# Patient Record
Sex: Female | Born: 1965 | Race: Black or African American | Hispanic: No | Marital: Single | Smoking: Never smoker
Health system: Southern US, Community
[De-identification: ages and names within clinical notes are randomized; demographics above are authoritative.]

## PROBLEM LIST (undated history)

## (undated) DIAGNOSIS — J45909 Unspecified asthma, uncomplicated: Secondary | ICD-10-CM

## (undated) DIAGNOSIS — D251 Intramural leiomyoma of uterus: Secondary | ICD-10-CM

## (undated) DIAGNOSIS — F4322 Adjustment disorder with anxiety: Secondary | ICD-10-CM

## (undated) DIAGNOSIS — I10 Essential (primary) hypertension: Secondary | ICD-10-CM

## (undated) DIAGNOSIS — G43829 Menstrual migraine, not intractable, without status migrainosus: Secondary | ICD-10-CM

## (undated) HISTORY — DX: Unspecified asthma, uncomplicated: J45.909

## (undated) HISTORY — DX: Intramural leiomyoma of uterus: D25.1

## (undated) HISTORY — PX: COSMETIC SURGERY: SHX468

## (undated) HISTORY — DX: Adjustment disorder with anxiety: F43.22

## (undated) HISTORY — DX: Menstrual migraine, not intractable, without status migrainosus: G43.829

## (undated) HISTORY — DX: Essential (primary) hypertension: I10

---

## 2014-05-01 ENCOUNTER — Emergency Department: Payer: Self-pay | Admitting: Emergency Medicine

## 2014-09-22 DIAGNOSIS — I10 Essential (primary) hypertension: Secondary | ICD-10-CM

## 2014-09-22 DIAGNOSIS — D649 Anemia, unspecified: Secondary | ICD-10-CM | POA: Insufficient documentation

## 2014-09-22 HISTORY — DX: Essential (primary) hypertension: I10

## 2014-10-13 DIAGNOSIS — D251 Intramural leiomyoma of uterus: Secondary | ICD-10-CM | POA: Insufficient documentation

## 2014-10-13 DIAGNOSIS — N920 Excessive and frequent menstruation with regular cycle: Secondary | ICD-10-CM | POA: Insufficient documentation

## 2014-10-13 DIAGNOSIS — F4322 Adjustment disorder with anxiety: Secondary | ICD-10-CM

## 2014-10-13 DIAGNOSIS — J45909 Unspecified asthma, uncomplicated: Secondary | ICD-10-CM

## 2014-10-13 DIAGNOSIS — G43829 Menstrual migraine, not intractable, without status migrainosus: Secondary | ICD-10-CM

## 2014-10-13 HISTORY — DX: Intramural leiomyoma of uterus: D25.1

## 2014-10-13 HISTORY — DX: Menstrual migraine, not intractable, without status migrainosus: G43.829

## 2014-10-13 HISTORY — DX: Unspecified asthma, uncomplicated: J45.909

## 2014-10-13 HISTORY — DX: Adjustment disorder with anxiety: F43.22

## 2015-09-11 ENCOUNTER — Other Ambulatory Visit: Payer: Self-pay | Admitting: Otolaryngology

## 2015-09-11 DIAGNOSIS — H9319 Tinnitus, unspecified ear: Secondary | ICD-10-CM

## 2015-09-15 ENCOUNTER — Ambulatory Visit: Payer: 59

## 2015-09-20 ENCOUNTER — Ambulatory Visit: Payer: 59

## 2015-10-06 ENCOUNTER — Ambulatory Visit: Payer: 59

## 2015-10-12 ENCOUNTER — Ambulatory Visit
Admission: RE | Admit: 2015-10-12 | Discharge: 2015-10-12 | Disposition: A | Discharge status: Home / Self Care | Payer: 59 | Source: Ambulatory Visit | Attending: Otolaryngology | Admitting: Otolaryngology

## 2015-10-12 ENCOUNTER — Ambulatory Visit: Payer: 59

## 2015-10-12 DIAGNOSIS — H9319 Tinnitus, unspecified ear: Secondary | ICD-10-CM | POA: Diagnosis not present

## 2015-10-12 DIAGNOSIS — H748X2 Other specified disorders of left middle ear and mastoid: Secondary | ICD-10-CM | POA: Diagnosis not present

## 2015-11-16 ENCOUNTER — Other Ambulatory Visit: Payer: Self-pay | Admitting: Otolaryngology

## 2015-11-16 DIAGNOSIS — H7012 Chronic mastoiditis, left ear: Secondary | ICD-10-CM

## 2015-12-19 ENCOUNTER — Encounter: Payer: Self-pay | Admitting: Obstetrics and Gynecology

## 2015-12-19 ENCOUNTER — Ambulatory Visit (INDEPENDENT_AMBULATORY_CARE_PROVIDER_SITE_OTHER): Payer: 59 | Admitting: Obstetrics and Gynecology

## 2015-12-19 VITALS — BP 118/85 | HR 89 | Ht 59.0 in | Wt 143.8 lb

## 2015-12-19 DIAGNOSIS — Z124 Encounter for screening for malignant neoplasm of cervix: Secondary | ICD-10-CM

## 2015-12-19 DIAGNOSIS — Z01419 Encounter for gynecological examination (general) (routine) without abnormal findings: Secondary | ICD-10-CM | POA: Diagnosis not present

## 2015-12-19 DIAGNOSIS — Z803 Family history of malignant neoplasm of breast: Secondary | ICD-10-CM | POA: Diagnosis not present

## 2015-12-19 DIAGNOSIS — Z1239 Encounter for other screening for malignant neoplasm of breast: Secondary | ICD-10-CM

## 2015-12-19 DIAGNOSIS — D6489 Other specified anemias: Secondary | ICD-10-CM | POA: Diagnosis not present

## 2015-12-19 NOTE — Patient Instructions (Signed)
Preventive Care for Adults, Female A healthy lifestyle and preventive care can promote health and wellness. Preventive health guidelines for women include the following key practices.  A routine yearly physical is a good way to check with your health care provider about your health and preventive screening. It is a chance to share any concerns and updates on your health and to receive a thorough exam.  Visit your dentist for a routine exam and preventive care every 6 months. Brush your teeth twice a day and floss once a day. Good oral hygiene prevents tooth decay and gum disease.  The frequency of eye exams is based on your age, health, family medical history, use of contact lenses, and other factors. Follow your health care provider's recommendations for frequency of eye exams.  Eat a healthy diet. Foods like vegetables, fruits, whole grains, low-fat dairy products, and lean protein foods contain the nutrients you need without too many calories. Decrease your intake of foods high in solid fats, added sugars, and salt. Eat the right amount of calories for you.Get information about a proper diet from your health care provider, if necessary.  Regular physical exercise is one of the most important things you can do for your health. Most adults should get at least 150 minutes of moderate-intensity exercise (any activity that increases your heart rate and causes you to sweat) each week. In addition, most adults need muscle-strengthening exercises on 2 or more days a week.  Maintain a healthy weight. The body mass index (BMI) is a screening tool to identify possible weight problems. It provides an estimate of body fat based on height and weight. Your health care provider can find your BMI and can help you achieve or maintain a healthy weight.For adults 20 years and older:  A BMI below 18.5 is considered underweight.  A BMI of 18.5 to 24.9 is normal.  A BMI of 25 to 29.9 is considered  overweight.  A BMI of 30 and above is considered obese.  Maintain normal blood lipids and cholesterol levels by exercising and minimizing your intake of saturated fat. Eat a balanced diet with plenty of fruit and vegetables. Blood tests for lipids and cholesterol should begin at age 64 and be repeated every 5 years. If your lipid or cholesterol levels are high, you are over 50, or you are at high risk for heart disease, you may need your cholesterol levels checked more frequently.Ongoing high lipid and cholesterol levels should be treated with medicines if diet and exercise are not working.  If you smoke, find out from your health care provider how to quit. If you do not use tobacco, do not start.  Lung cancer screening is recommended for adults aged 52-80 years who are at high risk for developing lung cancer because of a history of smoking. A yearly low-dose CT scan of the lungs is recommended for people who have at least a 30-pack-year history of smoking and are a current smoker or have quit within the past 15 years. A pack year of smoking is smoking an average of 1 pack of cigarettes a day for 1 year (for example: 1 pack a day for 30 years or 2 packs a day for 15 years). Yearly screening should continue until the smoker has stopped smoking for at least 15 years. Yearly screening should be stopped for people who develop a health problem that would prevent them from having lung cancer treatment.  If you are pregnant, do not drink alcohol. If you are  breastfeeding, be very cautious about drinking alcohol. If you are not pregnant and choose to drink alcohol, do not have more than 1 drink per day. One drink is considered to be 12 ounces (355 mL) of beer, 5 ounces (148 mL) of wine, or 1.5 ounces (44 mL) of liquor.  Avoid use of street drugs. Do not share needles with anyone. Ask for help if you need support or instructions about stopping the use of drugs.  High blood pressure causes heart disease and  increases the risk of stroke. Your blood pressure should be checked at least every 1 to 2 years. Ongoing high blood pressure should be treated with medicines if weight loss and exercise do not work.  If you are 25-78 years old, ask your health care provider if you should take aspirin to prevent strokes.  Diabetes screening is done by taking a blood sample to check your blood glucose level after you have not eaten for a certain period of time (fasting). If you are not overweight and you do not have risk factors for diabetes, you should be screened once every 3 years starting at age 86. If you are overweight or obese and you are 3-87 years of age, you should be screened for diabetes every year as part of your cardiovascular risk assessment.  Breast cancer screening is essential preventive care for women. You should practice "breast self-awareness." This means understanding the normal appearance and feel of your breasts and may include breast self-examination. Any changes detected, no matter how small, should be reported to a health care provider. Women in their 66s and 30s should have a clinical breast exam (CBE) by a health care provider as part of a regular health exam every 1 to 3 years. After age 43, women should have a CBE every year. Starting at age 37, women should consider having a mammogram (breast X-ray test) every year. Women who have a family history of breast cancer should talk to their health care provider about genetic screening. Women at a high risk of breast cancer should talk to their health care providers about having an MRI and a mammogram every year.  Breast cancer gene (BRCA)-related cancer risk assessment is recommended for women who have family members with BRCA-related cancers. BRCA-related cancers include breast, ovarian, tubal, and peritoneal cancers. Having family members with these cancers may be associated with an increased risk for harmful changes (mutations) in the breast  cancer genes BRCA1 and BRCA2. Results of the assessment will determine the need for genetic counseling and BRCA1 and BRCA2 testing.  Your health care provider may recommend that you be screened regularly for cancer of the pelvic organs (ovaries, uterus, and vagina). This screening involves a pelvic examination, including checking for microscopic changes to the surface of your cervix (Pap test). You may be encouraged to have this screening done every 3 years, beginning at age 78.  For women ages 79-65, health care providers may recommend pelvic exams and Pap testing every 3 years, or they may recommend the Pap and pelvic exam, combined with testing for human papilloma virus (HPV), every 5 years. Some types of HPV increase your risk of cervical cancer. Testing for HPV may also be done on women of any age with unclear Pap test results.  Other health care providers may not recommend any screening for nonpregnant women who are considered low risk for pelvic cancer and who do not have symptoms. Ask your health care provider if a screening pelvic exam is right for  you.  If you have had past treatment for cervical cancer or a condition that could lead to cancer, you need Pap tests and screening for cancer for at least 20 years after your treatment. If Pap tests have been discontinued, your risk factors (such as having a new sexual partner) need to be reassessed to determine if screening should resume. Some women have medical problems that increase the chance of getting cervical cancer. In these cases, your health care provider may recommend more frequent screening and Pap tests.  Colorectal cancer can be detected and often prevented. Most routine colorectal cancer screening begins at the age of 50 years and continues through age 75 years. However, your health care provider may recommend screening at an earlier age if you have risk factors for colon cancer. On a yearly basis, your health care provider may provide  home test kits to check for hidden blood in the stool. Use of a small camera at the end of a tube, to directly examine the colon (sigmoidoscopy or colonoscopy), can detect the earliest forms of colorectal cancer. Talk to your health care provider about this at age 50, when routine screening begins. Direct exam of the colon should be repeated every 5-10 years through age 75 years, unless early forms of precancerous polyps or small growths are found.  People who are at an increased risk for hepatitis B should be screened for this virus. You are considered at high risk for hepatitis B if:  You were born in a country where hepatitis B occurs often. Talk with your health care provider about which countries are considered high risk.  Your parents were born in a high-risk country and you have not received a shot to protect against hepatitis B (hepatitis B vaccine).  You have HIV or AIDS.  You use needles to inject street drugs.  You live with, or have sex with, someone who has hepatitis B.  You get hemodialysis treatment.  You take certain medicines for conditions like cancer, organ transplantation, and autoimmune conditions.  Hepatitis C blood testing is recommended for all people born from 1945 through 1965 and any individual with known risks for hepatitis C.  Practice safe sex. Use condoms and avoid high-risk sexual practices to reduce the spread of sexually transmitted infections (STIs). STIs include gonorrhea, chlamydia, syphilis, trichomonas, herpes, HPV, and human immunodeficiency virus (HIV). Herpes, HIV, and HPV are viral illnesses that have no cure. They can result in disability, cancer, and death.  You should be screened for sexually transmitted illnesses (STIs) including gonorrhea and chlamydia if:  You are sexually active and are younger than 24 years.  You are older than 24 years and your health care provider tells you that you are at risk for this type of infection.  Your sexual  activity has changed since you were last screened and you are at an increased risk for chlamydia or gonorrhea. Ask your health care provider if you are at risk.  If you are at risk of being infected with HIV, it is recommended that you take a prescription medicine daily to prevent HIV infection. This is called preexposure prophylaxis (PrEP). You are considered at risk if:  You are sexually active and do not regularly use condoms or know the HIV status of your partner(s).  You take drugs by injection.  You are sexually active with a partner who has HIV.  Talk with your health care provider about whether you are at high risk of being infected with HIV. If   you choose to begin PrEP, you should first be tested for HIV. You should then be tested every 3 months for as long as you are taking PrEP.  Osteoporosis is a disease in which the bones lose minerals and strength with aging. This can result in serious bone fractures or breaks. The risk of osteoporosis can be identified using a bone density scan. Women ages 1 years and over and women at risk for fractures or osteoporosis should discuss screening with their health care providers. Ask your health care provider whether you should take a calcium supplement or vitamin D to reduce the rate of osteoporosis.  Menopause can be associated with physical symptoms and risks. Hormone replacement therapy is available to decrease symptoms and risks. You should talk to your health care provider about whether hormone replacement therapy is right for you.  Use sunscreen. Apply sunscreen liberally and repeatedly throughout the day. You should seek shade when your shadow is shorter than you. Protect yourself by wearing long sleeves, pants, a wide-brimmed hat, and sunglasses year round, whenever you are outdoors.  Once a month, do a whole body skin exam, using a mirror to look at the skin on your back. Tell your health care provider of new moles, moles that have irregular  borders, moles that are larger than a pencil eraser, or moles that have changed in shape or color.  Stay current with required vaccines (immunizations).  Influenza vaccine. All adults should be immunized every year.  Tetanus, diphtheria, and acellular pertussis (Td, Tdap) vaccine. Pregnant women should receive 1 dose of Tdap vaccine during each pregnancy. The dose should be obtained regardless of the length of time since the last dose. Immunization is preferred during the 27th-36th week of gestation. An adult who has not previously received Tdap or who does not know her vaccine status should receive 1 dose of Tdap. This initial dose should be followed by tetanus and diphtheria toxoids (Td) booster doses every 10 years. Adults with an unknown or incomplete history of completing a 3-dose immunization series with Td-containing vaccines should begin or complete a primary immunization series including a Tdap dose. Adults should receive a Td booster every 10 years.  Varicella vaccine. An adult without evidence of immunity to varicella should receive 2 doses or a second dose if she has previously received 1 dose. Pregnant females who do not have evidence of immunity should receive the first dose after pregnancy. This first dose should be obtained before leaving the health care facility. The second dose should be obtained 4-8 weeks after the first dose.  Human papillomavirus (HPV) vaccine. Females aged 13-26 years who have not received the vaccine previously should obtain the 3-dose series. The vaccine is not recommended for use in pregnant females. However, pregnancy testing is not needed before receiving a dose. If a female is found to be pregnant after receiving a dose, no treatment is needed. In that case, the remaining doses should be delayed until after the pregnancy. Immunization is recommended for any person with an immunocompromised condition through the age of 24 years if she did not get any or all doses  earlier. During the 3-dose series, the second dose should be obtained 4-8 weeks after the first dose. The third dose should be obtained 24 weeks after the first dose and 16 weeks after the second dose.  Zoster vaccine. One dose is recommended for adults aged 97 years or older unless certain conditions are present.  Measles, mumps, and rubella (MMR) vaccine. Adults born  before 1957 generally are considered immune to measles and mumps. Adults born in [birth year] or later should have 1 or more doses of MMR vaccine unless there is a contraindication to the vaccine or there is laboratory evidence of immunity to each of the three diseases. A routine second dose of MMR vaccine should be obtained at least 28 days after the first dose for students attending postsecondary schools, health care workers, or international travelers. People who received inactivated measles vaccine or an unknown type of measles vaccine during 1963-1967 should receive 2 doses of MMR vaccine. People who received inactivated mumps vaccine or an unknown type of mumps vaccine before 1979 and are at high risk for mumps infection should consider immunization with 2 doses of MMR vaccine. For females of childbearing age, rubella immunity should be determined. If there is no evidence of immunity, females who are not pregnant should be vaccinated. If there is no evidence of immunity, females who are pregnant should delay immunization until after pregnancy. Unvaccinated health care workers born before 60 who lack laboratory evidence of measles, mumps, or rubella immunity or laboratory confirmation of disease should consider measles and mumps immunization with 2 doses of MMR vaccine or rubella immunization with 1 dose of MMR vaccine.  Pneumococcal 13-valent conjugate (PCV13) vaccine. When indicated, a person who is uncertain of his immunization history and has no record of immunization should receive the PCV13 vaccine. All adults 61 years of age and older  should receive this vaccine. An adult aged 92 years or older who has certain medical conditions and has not been previously immunized should receive 1 dose of PCV13 vaccine. This PCV13 should be followed with a dose of pneumococcal polysaccharide (PPSV23) vaccine. Adults who are at high risk for pneumococcal disease should obtain the PPSV23 vaccine at least 8 weeks after the dose of PCV13 vaccine. Adults older than 48 years of age who have normal immune system function should obtain the PPSV23 vaccine dose at least 1 year after the dose of PCV13 vaccine.  Pneumococcal polysaccharide (PPSV23) vaccine. When PCV13 is also indicated, PCV13 should be obtained first. All adults aged 2 years and older should be immunized. An adult younger than age 30 years who has certain medical conditions should be immunized. Any person who resides in a nursing home or long-term care facility should be immunized. An adult smoker should be immunized. People with an immunocompromised condition and certain other conditions should receive both PCV13 and PPSV23 vaccines. People with human immunodeficiency virus (HIV) infection should be immunized as soon as possible after diagnosis. Immunization during chemotherapy or radiation therapy should be avoided. Routine use of PPSV23 vaccine is not recommended for American Indians, [place] Natives, or people younger than 65 years unless there are medical conditions that require PPSV23 vaccine. When indicated, people who have unknown immunization and have no record of immunization should receive PPSV23 vaccine. One-time revaccination 5 years after the first dose of PPSV23 is recommended for people aged 19-64 years who have chronic kidney failure, nephrotic syndrome, asplenia, or immunocompromised conditions. People who received 1-2 doses of PPSV23 before age 44 years should receive another dose of PPSV23 vaccine at age 83 years or later if at least 5 years have passed since the previous dose. Doses  of PPSV23 are not needed for people immunized with PPSV23 at or after age 20 years.  Meningococcal vaccine. Adults with asplenia or persistent complement component deficiencies should receive 2 doses of quadrivalent meningococcal conjugate (MenACWY-D) vaccine. The doses should be obtained  at least 2 months apart. Microbiologists working with certain meningococcal bacteria, [place] recruits, people at risk during an outbreak, and people who travel to or live in countries with a high rate of meningitis should be immunized. A first-year college student up through age 28 years who is living in a residence hall should receive a dose if she did not receive a dose on or after her 16th birthday. Adults who have certain high-risk conditions should receive one or more doses of vaccine.  Hepatitis A vaccine. Adults who wish to be protected from this disease, have certain high-risk conditions, work with hepatitis A-infected animals, work in hepatitis A research labs, or travel to or work in countries with a high rate of hepatitis A should be immunized. Adults who were previously unvaccinated and who anticipate close contact with an international adoptee during the first 60 days after arrival in the [place] from a country with a high rate of hepatitis A should be immunized.  Hepatitis B vaccine. Adults who wish to be protected from this disease, have certain high-risk conditions, may be exposed to blood or other infectious body fluids, are household contacts or sex partners of hepatitis B positive people, are clients or workers in certain care facilities, or travel to or work in countries with a high rate of hepatitis B should be immunized.  Haemophilus influenzae type b (Hib) vaccine. A previously unvaccinated person with asplenia or sickle cell disease or having a scheduled splenectomy should receive 1 dose of Hib vaccine. Regardless of previous immunization, a recipient of a hematopoietic stem cell transplant  should receive a 3-dose series 6-12 months after her successful transplant. Hib vaccine is not recommended for adults with HIV infection. Preventive Services / Frequency Ages 71 to 87 years  Blood pressure check.** / Every 3-5 years.  Lipid and cholesterol check.** / Every 5 years beginning at age 1.  Clinical breast exam.** / Every 3 years for women in their 3s and 31s.  BRCA-related cancer risk assessment.** / For women who have family members with a BRCA-related cancer (breast, ovarian, tubal, or peritoneal cancers).  Pap test.** / Every 2 years from ages 50 through 86. Every 3 years starting at age 87 through age 7 or 75 with a history of 3 consecutive normal Pap tests.  HPV screening.** / Every 3 years from ages 59 through ages 35 to 6 with a history of 3 consecutive normal Pap tests.  Hepatitis C blood test.** / For any individual with known risks for hepatitis C.  Skin self-exam. / Monthly.  Influenza vaccine. / Every year.  Tetanus, diphtheria, and acellular pertussis (Tdap, Td) vaccine.** / Consult your health care provider. Pregnant women should receive 1 dose of Tdap vaccine during each pregnancy. 1 dose of Td every 10 years.  Varicella vaccine.** / Consult your health care provider. Pregnant females who do not have evidence of immunity should receive the first dose after pregnancy.  HPV vaccine. / 3 doses over 6 months, if 72 and younger. The vaccine is not recommended for use in pregnant females. However, pregnancy testing is not needed before receiving a dose.  Measles, mumps, rubella (MMR) vaccine.** / You need at least 1 dose of MMR if you were born in 1957 or later. You may also need a 2nd dose. For females of childbearing age, rubella immunity should be determined. If there is no evidence of immunity, females who are not pregnant should be vaccinated. If there is no evidence of immunity, females who are  pregnant should delay immunization until after  pregnancy.  Pneumococcal 13-valent conjugate (PCV13) vaccine.** / Consult your health care provider.  Pneumococcal polysaccharide (PPSV23) vaccine.** / 1 to 2 doses if you smoke cigarettes or if you have certain conditions.  Meningococcal vaccine.** / 1 dose if you are age 87 to 44 years and a Market researcher living in a residence hall, or have one of several medical conditions, you need to get vaccinated against meningococcal disease. You may also need additional booster doses.  Hepatitis A vaccine.** / Consult your health care provider.  Hepatitis B vaccine.** / Consult your health care provider.  Haemophilus influenzae type b (Hib) vaccine.** / Consult your health care provider. Ages 86 to 38 years  Blood pressure check.** / Every year.  Lipid and cholesterol check.** / Every 5 years beginning at age 49 years.  Lung cancer screening. / Every year if you are aged 71-80 years and have a 30-pack-year history of smoking and currently smoke or have quit within the past 15 years. Yearly screening is stopped once you have quit smoking for at least 15 years or develop a health problem that would prevent you from having lung cancer treatment.  Clinical breast exam.** / Every year after age 51 years.  BRCA-related cancer risk assessment.** / For women who have family members with a BRCA-related cancer (breast, ovarian, tubal, or peritoneal cancers).  Mammogram.** / Every year beginning at age 18 years and continuing for as long as you are in good health. Consult with your health care provider.  Pap test.** / Every 3 years starting at age 63 years through age 37 or 57 years with a history of 3 consecutive normal Pap tests.  HPV screening.** / Every 3 years from ages 41 years through ages 76 to 23 years with a history of 3 consecutive normal Pap tests.  Fecal occult blood test (FOBT) of stool. / Every year beginning at age 36 years and continuing until age 51 years. You may not need  to do this test if you get a colonoscopy every 10 years.  Flexible sigmoidoscopy or colonoscopy.** / Every 5 years for a flexible sigmoidoscopy or every 10 years for a colonoscopy beginning at age 36 years and continuing until age 35 years.  Hepatitis C blood test.** / For all people born from [birth year] through 1965 and any individual with known risks for hepatitis C.  Skin self-exam. / Monthly.  Influenza vaccine. / Every year.  Tetanus, diphtheria, and acellular pertussis (Tdap/Td) vaccine.** / Consult your health care provider. Pregnant women should receive 1 dose of Tdap vaccine during each pregnancy. 1 dose of Td every 10 years.  Varicella vaccine.** / Consult your health care provider. Pregnant females who do not have evidence of immunity should receive the first dose after pregnancy.  Zoster vaccine.** / 1 dose for adults aged 73 years or older.  Measles, mumps, rubella (MMR) vaccine.** / You need at least 1 dose of MMR if you were born in 1957 or later. You may also need a second dose. For females of childbearing age, rubella immunity should be determined. If there is no evidence of immunity, females who are not pregnant should be vaccinated. If there is no evidence of immunity, females who are pregnant should delay immunization until after pregnancy.  Pneumococcal 13-valent conjugate (PCV13) vaccine.** / Consult your health care provider.  Pneumococcal polysaccharide (PPSV23) vaccine.** / 1 to 2 doses if you smoke cigarettes or if you have certain conditions.  Meningococcal vaccine.** /  Consult your health care provider.  Hepatitis A vaccine.** / Consult your health care provider.  Hepatitis B vaccine.** / Consult your health care provider.  Haemophilus influenzae type b (Hib) vaccine.** / Consult your health care provider. Ages 80 years and over  Blood pressure check.** / Every year.  Lipid and cholesterol check.** / Every 5 years beginning at age 62 years.  Lung cancer  screening. / Every year if you are aged 32-80 years and have a 30-pack-year history of smoking and currently smoke or have quit within the past 15 years. Yearly screening is stopped once you have quit smoking for at least 15 years or develop a health problem that would prevent you from having lung cancer treatment.  Clinical breast exam.** / Every year after age 61 years.  BRCA-related cancer risk assessment.** / For women who have family members with a BRCA-related cancer (breast, ovarian, tubal, or peritoneal cancers).  Mammogram.** / Every year beginning at age 39 years and continuing for as long as you are in good health. Consult with your health care provider.  Pap test.** / Every 3 years starting at age 85 years through age 74 or 72 years with 3 consecutive normal Pap tests. Testing can be stopped between 65 and 70 years with 3 consecutive normal Pap tests and no abnormal Pap or HPV tests in the past 10 years.  HPV screening.** / Every 3 years from ages 55 years through ages 67 or 77 years with a history of 3 consecutive normal Pap tests. Testing can be stopped between 65 and 70 years with 3 consecutive normal Pap tests and no abnormal Pap or HPV tests in the past 10 years.  Fecal occult blood test (FOBT) of stool. / Every year beginning at age 81 years and continuing until age 22 years. You may not need to do this test if you get a colonoscopy every 10 years.  Flexible sigmoidoscopy or colonoscopy.** / Every 5 years for a flexible sigmoidoscopy or every 10 years for a colonoscopy beginning at age 67 years and continuing until age 22 years.  Hepatitis C blood test.** / For all people born from [birth year] through 1963 and any individual with known risks for hepatitis C.  Osteoporosis screening.** / A one-time screening for women ages 8 years and over and women at risk for fractures or osteoporosis.  Skin self-exam. / Monthly.  Influenza vaccine. / Every year.  Tetanus, diphtheria, and  acellular pertussis (Tdap/Td) vaccine.** / 1 dose of Td every 10 years.  Varicella vaccine.** / Consult your health care provider.  Zoster vaccine.** / 1 dose for adults aged 56 years or older.  Pneumococcal 13-valent conjugate (PCV13) vaccine.** / Consult your health care provider.  Pneumococcal polysaccharide (PPSV23) vaccine.** / 1 dose for all adults aged 15 years and older.  Meningococcal vaccine.** / Consult your health care provider.  Hepatitis A vaccine.** / Consult your health care provider.  Hepatitis B vaccine.** / Consult your health care provider.  Haemophilus influenzae type b (Hib) vaccine.** / Consult your health care provider. ** Family history and personal history of risk and conditions may change your health care provider's recommendations.   This information is not intended to replace advice given to you by your health care provider. Make sure you discuss any questions you have with your health care provider.   Document Released: 01/28/2002 Document Revised: 12/23/2014 Document Reviewed: 04/29/2011 Elsevier Interactive Patient Education [employer].

## 2015-12-19 NOTE — Progress Notes (Signed)
GYNECOLOGY ANNUAL EXAM CLINIC VISIT Subjective:    Briana [name] is a 50 y.o. [phone] female who presents to establish care and for an annual exam. The patient is transitioning care from [facility], [place]. The patient has no complaints today. The patient is sexually active (1 partner). GYN screening history: last pap: approximate date 2-3 years ago and was normal. The patient wears seatbelts: yes. The patient participates in regular exercise: no. Has the patient ever been transfused or tattooed?: no. The patient reports that there is not domestic violence in her life.   Menstrual History: OB History  Gravida Para Term Preterm AB SAB TAB Ectopic Multiple Living  6 2 2  4     2     # Outcome Date GA Lbr Len/2nd Weight Sex Delivery Anes PTL Lv  6 Term 33     Vag-Spont   Y  5 Term 26     Vag-Spont   Y  4 AB           3 AB           2 AB           1 AB               Menarche age: 50 Patient's last menstrual period was 11/25/2015. Period Pattern: Regular Menstrual Flow: Heavy Denies h/o abnormal pap smears, or STIs.    Past Medical History  Diagnosis Date  . Asthma   . Fibroids, intramural 10/13/2014  . Essential (primary) hypertension 09/22/2014  . Headache, menstrual migraine 10/13/2014  . Adjustment disorder with anxiety 10/13/2014    Family History  Problem Relation Age of Onset  . Hypertension Mother   . Breast cancer Mother 70    Past Surgical History  Procedure Laterality Date  . Cosmetic surgery      Social History   Social History  . Marital Status: Single    Spouse Name: N/A  . Number of Children: N/A  . Years of Education: N/A   Occupational History  . Not on file.   Social History Main Topics  . Smoking status: Never Smoker   . Smokeless tobacco: Not on file  . Alcohol Use: Yes  . Drug Use: No  . Sexual Activity: Yes    Birth Control/ Protection: Pill   Other Topics Concern  . Not on file   Social History Narrative  . No narrative on  file    Outpatient Encounter Prescriptions as of 12/19/2015  Medication Sig Note  . amLODipine (NORVASC) 10 MG tablet TAKE 1 TABLET (10 MG TOTAL) BY MOUTH DAILY. 12/19/2015: Received from: External Pharmacy  . azelastine (ASTELIN) 0.1 % nasal spray SPRAY ONCE IN EACH NOSTRIL TWICE DAILY 12/19/2015: Received from: External Pharmacy  . Fluticasone-Salmeterol (ADVAIR DISKUS) 250-50 MCG/DOSE AEPB INHALE 1 PUFF 2 TIMES A DAY. Appointment necessary for further refills. 12/19/2015: Received from: [facility]  . loratadine (CLARITIN) 10 MG tablet Take 10 mg by mouth. 12/19/2015: Received from: [facility]  . ORTHO MICRONOR 0.35 MG tablet  12/19/2015: Received from: External Pharmacy  . QNASL 80 MCG/ACT AERS Place 2 sprays into both nostrils daily. 12/19/2015: Received from: External Pharmacy  . [DISCONTINUED] amLODipine (NORVASC) 10 MG tablet Take 10 mg by mouth. 12/19/2015: Received from: [facility]   No facility-administered encounter medications on file as of 12/19/2015.    No Known Allergies   Review of Systems Constitutional: negative for chills, fatigue, fevers and sweats Eyes: negative for  irritation, redness and visual disturbance Ears, nose, mouth, throat, and face: negative for hearing loss, nasal congestion, snoring and tinnitus Respiratory: negative for asthma, cough, sputum Cardiovascular: negative for chest pain, dyspnea, exertional chest pressure/discomfort, irregular heart beat, palpitations and syncope Gastrointestinal: negative for abdominal pain, change in bowel habits, nausea and vomiting Genitourinary: positive for heavy menstrual periods; negative genital lesions, sexual problems and vaginal discharge, dysuria and urinary incontinence Integument/breast: negative for breast lump, breast tenderness and nipple discharge Hematologic/lymphatic: negative for bleeding and easy bruising Musculoskeletal:negative for back pain and muscle weakness Neurological: negative for dizziness,  headaches, vertigo and weakness Endocrine: negative for diabetic symptoms including polydipsia, polyuria and skin dryness Allergic/Immunologic: negative for hay fever and urticaria     Objective:    Blood pressure 118/85, pulse 89, height 4\' 11"  (1.499 m), weight 143 lb 12.8 oz (65.227 kg), last menstrual period 11/25/2015. Body mass index is 29.03 kg/(m^2).   General Appearance:    Alert, cooperative, no distress, appears stated age  Head:    Normocephalic, without obvious abnormality, atraumatic  Eyes:    PERRL, conjunctiva/corneas clear, EOM's intact, both eyes  Ears:    Normal external ear canals, both ears  Nose:   Nares normal, septum midline, mucosa normal, no drainage or sinus tenderness  Throat:   Lips, mucosa, and tongue normal; teeth and gums normal  Neck:   Supple, symmetrical, trachea midline, no adenopathy; thyroid: no enlargement/tenderness/nodules; no carotid bruit or JVD  Back:     Symmetric, no curvature, ROM normal, no CVA tenderness  Lungs:     Clear to auscultation bilaterally, respirations unlabored  Chest Wall:    No tenderness or deformity   Heart:    Regular rate and rhythm, S1 and S2 normal, no murmur, rub or gallop  Breast Exam:    No tenderness, masses, or nipple abnormality  Abdomen:     Soft, non-tender, bowel sounds active all four quadrants, no masses, no organomegaly.  Well healed low transverse abdominal scar.    Genitalia:    Pelvic:external genitalia normal, vagina without lesions, discharge, or tenderness, rectovaginal septum  normal. Cervix normal in appearance, no cervical motion tenderness, no adnexal masses or tenderness. Uterus slightly enlarged (~ 12 week sized), mobile, nontender.   Rectal:    Normal external sphincter.  No hemorrhoids appreciated. Internal exam not done.   Extremities:   Extremities normal, atraumatic, no cyanosis or edema  Pulses:   2+ and symmetric all extremities  Skin:   Skin color, texture, turgor normal, no rashes or  lesions  Lymph nodes:   Cervical, supraclavicular, and axillary nodes normal  Neurologic:   CNII-XII intact, normal strength, sensation and reflexes throughout    Assessment:    Healthy female exam.   HTN Heavy menses Overweight  Plan:     Breast self exam technique reviewed and patient encouraged to perform self-exam monthly. Contraception: oral progesterone-only contraceptive. Has been on pills x 1 month (previously on Mirena IUD for heavy menses but removed after ~ 6 months due to side effects). Noted abnormal prolonged spotting for first month which resolved last week. Discussed healthy lifestyle modifications. Pap smear.   Up to date on immunizations.  Has PCP to manage HTN. Due to be seen next month.  Follow up in 1 year, or as needed.   Rubie Maid, MD [facility] 12/19/2015 9:09 AM

## 2015-12-20 ENCOUNTER — Ambulatory Visit: Admission: RE | Admit: 2015-12-20 | Payer: 59 | Source: Ambulatory Visit

## 2015-12-22 LAB — PAP IG AND HPV HIGH-RISK
HPV, HIGH-RISK: NEGATIVE
PAP SMEAR COMMENT: 0

## 2015-12-26 ENCOUNTER — Telehealth: Payer: Self-pay

## 2015-12-26 NOTE — Telephone Encounter (Signed)
-----   Message from Rubie Maid, MD sent at 12/22/2015  2:01 PM EST ----- Please inform patient of negative pap, and sign up for mychart.

## 2015-12-26 NOTE — Telephone Encounter (Signed)
Called pt & LM for pt to call back

## 2016-07-04 ENCOUNTER — Ambulatory Visit
Admission: RE | Admit: 2016-07-04 | Discharge: 2016-07-04 | Disposition: A | Discharge status: Home / Self Care | Payer: 59 | Source: Ambulatory Visit | Attending: Internal Medicine | Admitting: Internal Medicine

## 2016-07-04 ENCOUNTER — Ambulatory Visit (INDEPENDENT_AMBULATORY_CARE_PROVIDER_SITE_OTHER): Payer: 59 | Admitting: Internal Medicine

## 2016-07-04 ENCOUNTER — Encounter: Payer: Self-pay | Admitting: Internal Medicine

## 2016-07-04 ENCOUNTER — Other Ambulatory Visit
Admission: RE | Admit: 2016-07-04 | Discharge: 2016-07-04 | Disposition: A | Discharge status: Home / Self Care | Payer: 59 | Source: Ambulatory Visit | Attending: Internal Medicine | Admitting: Internal Medicine

## 2016-07-04 VITALS — BP 132/68 | HR 73 | Ht 59.5 in | Wt 145.4 lb

## 2016-07-04 DIAGNOSIS — J45909 Unspecified asthma, uncomplicated: Secondary | ICD-10-CM | POA: Insufficient documentation

## 2016-07-04 DIAGNOSIS — R0602 Shortness of breath: Secondary | ICD-10-CM

## 2016-07-04 DIAGNOSIS — J4520 Mild intermittent asthma, uncomplicated: Secondary | ICD-10-CM

## 2016-07-04 DIAGNOSIS — G4730 Sleep apnea, unspecified: Secondary | ICD-10-CM

## 2016-07-04 NOTE — Patient Instructions (Signed)
1.check PFT's with [facility] 2.CXR 3.sleep study 4.avoid allergens  Asthma Attack Prevention While you may not be able to control the fact that you have asthma, you can take actions to prevent asthma attacks. The best way to prevent asthma attacks is to maintain good control of your asthma. You can achieve this by:  Taking your medicines as directed.  Avoiding things that can irritate your airways or make your asthma symptoms worse (asthma triggers).  Keeping track of how well your asthma is controlled and of any changes in your symptoms.  Responding quickly to worsening asthma symptoms (asthma attack).  Seeking emergency care when it is needed. WHAT ARE SOME WAYS TO PREVENT AN ASTHMA ATTACK? Have a Plan Work with your health care provider to create a written plan for managing and treating your asthma attacks (asthma action plan). This plan includes:  A list of your asthma triggers and how you can avoid them.  Information on when medicines should be taken and when their dosages should be changed.  The use of a device that measures how well your lungs are working (peak flow meter). Monitor Your Asthma Use your peak flow meter and record your results in a journal every day. A drop in your peak flow numbers on one or more days may indicate the start of an asthma attack. This can happen even before you start to feel symptoms. You can prevent an asthma attack from getting worse by following the steps in your asthma action plan. Avoid Asthma Triggers Work with your asthma health care provider to find out what your asthma triggers are. This can be done by:  Allergy testing.  Keeping a journal that notes when asthma attacks occur and the factors that may have contributed to them.  Determining if there are other medical conditions that are making your asthma worse. Once you have determined your asthma triggers, take steps to avoid them. This may include avoiding excessive or prolonged exposure  to:  Dust. Have someone dust and vacuum your home for you once or twice a week. Using a high-efficiency particulate arrestance (HEPA) vacuum is best.  Smoke. This includes campfire smoke, forest fire smoke, and secondhand smoke from tobacco products.  Pet dander. Avoid contact with animals that you know you are allergic to.  Allergens from trees, grasses or pollens. Avoid spending a lot of time outdoors when pollen counts are high, and on very windy days.  Very cold, dry, or humid air.  Mold.  Foods that contain high amounts of sulfites.  Strong odors.  Outdoor air pollutants, such as Lexic[employer].  Indoor air pollutants, such as aerosol sprays and fumes from household cleaners.  Household pests, including dust mites and cockroaches, and pest droppings.  Certain medicines, including NSAIDs. Always talk to your health care provider before stopping or starting any new medicines. Medicines Take over-the-counter and prescription medicines only as told by your health care provider. Many asthma attacks can be prevented by carefully following your medicine schedule. Taking your medicines correctly is especially important when you cannot avoid certain asthma triggers. Act Quickly If an asthma attack does happen, acting quickly can decrease how severe it is and how long it lasts. Take these steps:   Pay attention to your symptoms. If you are coughing, wheezing, or having difficulty breathing, do not wait to see if your symptoms go away on their own. Follow your asthma action plan.  If you have followed your asthma action plan and your symptoms are not improving,  call your health care provider or seek immediate medical care at the nearest hospital. It is important to note how often you need to use your fast-acting rescue inhaler. If you are using your rescue inhaler more often, it may mean that your asthma is not under control. Adjusting your asthma treatment plan may help you to prevent  future asthma attacks and help you to gain better control of your condition. HOW CAN I PREVENT AN ASTHMA ATTACK WHEN I EXERCISE? Follow advice from your health care provider about whether you should use your fast-acting inhaler before exercising. Many people with asthma experience exercise-induced bronchoconstriction (EIB). This condition often worsens during vigorous exercise in cold, humid, or dry environments. Usually, people with EIB can stay very active by pre-treating with a fast-acting inhaler before exercising.   This information is not intended to replace advice given to you by your health care provider. Make sure you discuss any questions you have with your health care provider.   Document Released: 11/20/2009 Document Revised: 08/23/2015 Document Reviewed: 05/04/2015 Elsevier Interactive Patient Education [employer].

## 2016-07-04 NOTE — Progress Notes (Signed)
[facility] Consultation      Date: 07/04/2016,   MRN# [id number] Briana [name] 1966/06/02 Code Status:  Code Status History    This patient does not have a recorded code status. Please follow your organizational policy for patients in this situation.     Hosp day:@LENGTHOFSTAYDAYS @ Referring MD: @ATDPROV @     PCP:      AdmissionWeight: 145 lb 6.4 oz (65.953 kg)                 CurrentWeight: 145 lb 6.4 oz (65.953 kg) Briana [name] is a 49 y.o. old female seen in consultation for SOB at the request of patient.     CHIEF COMPLAINT:   SOB, can't take deep breath   HISTORY OF PRESENT ILLNESS  50 yo AAF seen today for main complaint of not getting enough of a deep breath Patient has been having this problem for 6 months, no fevers, chills. 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 Asthma   . Fibroids, intramural 10/13/2014  . 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 Essential (primary) hypertension 09/22/2014  . Headache, menstrual migraine 10/13/2014  . Adjustment disorder with anxiety 10/13/2014     SURGICAL HISTORY   Past Surgical History  Procedure Laterality Date  . Cosmetic surgery       FAMILY HISTORY   Family  History  Problem Relation Age of Onset  . Hypertension Mother   . Breast cancer Mother 23     SOCIAL HISTORY   Social History  Substance Use Topics  . Smoking status: Never Smoker   . Smokeless tobacco: None  . Alcohol Use: Yes     Comment: occ     MEDICATIONS    Home Medication:  Current Outpatient Rx  Name  Route  Sig  Dispense  Refill  . amLODipine (NORVASC) 10 MG tablet      TAKE 1 TABLET (10 MG TOTAL) BY MOUTH DAILY.      0   . azelastine (ASTELIN) 0.1 % nasal spray      SPRAY ONCE IN EACH NOSTRIL TWICE DAILY      12   . Fluticasone-Salmeterol (ADVAIR DISKUS) 250-50 MCG/DOSE AEPB      INHALE 1 PUFF 2 TIMES A DAY. Appointment necessary for further refills.         Marland Kitchen loratadine (CLARITIN) 10 MG tablet   Oral   Take 10 mg by mouth.         Briana [name] 80 MCG/ACT AERS   Each Nare   Place 2 sprays into both nostrils daily.      12     Dispense as written.     Current Medication:  Current outpatient prescriptions:  .  amLODipine (NORVASC) 10 MG tablet, TAKE 1 TABLET (10 MG TOTAL) BY MOUTH DAILY., Disp: , Rfl: 0 .  azelastine (ASTELIN) 0.1 % nasal spray, SPRAY ONCE IN EACH NOSTRIL TWICE DAILY, Disp: , Rfl: 12 .  Fluticasone-Salmeterol (ADVAIR DISKUS) 250-50 MCG/DOSE AEPB, INHALE 1 PUFF 2 TIMES A DAY. Appointment necessary for further refills., Disp: , Rfl:  .  loratadine (CLARITIN) 10 MG tablet, Take 10 mg by mouth., Disp: , Rfl:  .  QNASL 80 MCG/ACT AERS, Place 2 sprays into both nostrils daily., Disp: , Rfl: 12    ALLERGIES   Review of patient's allergies indicates no known allergies.     REVIEW OF SYSTEMS   Review of Systems  Constitutional: Positive for malaise/fatigue. Negative for fever, chills, weight loss and diaphoresis.  HENT: Negative for congestion and hearing loss.   Eyes: Negative for blurred vision and double vision.  Respiratory: Negative for cough, hemoptysis, sputum production, shortness of breath and wheezing.     Cardiovascular: Negative for chest pain, palpitations and orthopnea.  Gastrointestinal: Negative for heartburn, nausea, vomiting, abdominal pain, diarrhea, constipation and blood in stool.  Genitourinary: Negative for dysuria and urgency.  Musculoskeletal: Negative for myalgias, back pain and neck pain.  Skin: Negative for rash.  Neurological: Negative for dizziness, tingling, tremors, weakness and headaches.  Endo/Heme/Allergies: Does not bruise/bleed easily.  Psychiatric/Behavioral: Negative for depression, suicidal ideas and substance abuse.  All other systems reviewed and are negative.    VS: BP 132/68 mmHg  Pulse 73  Ht 4' 11.5" (1.511 m)  Wt 145 lb 6.4 oz (65.953 kg)  BMI 28.89 kg/m2  SpO2 97%     PHYSICAL EXAM  Physical Exam  Constitutional: She is oriented to person, place, and time. She appears well-developed and well-nourished. No distress.  HENT:  Head: Normocephalic and atraumatic.  Mouth/Throat: No oropharyngeal exudate.  Eyes: EOM are normal. Pupils are equal, round, and reactive to light. No scleral icterus.  Neck: Normal range of motion. Neck supple.  Cardiovascular: Normal rate, regular rhythm and normal heart sounds.   No murmur heard. Pulmonary/Chest: No stridor. No respiratory distress. She has no wheezes. She has no rales.  Abdominal: Soft. Bowel sounds are normal.  Musculoskeletal: Normal range of motion. She exhibits no edema.  Neurological: She is alert and oriented to person, place, and time. No cranial nerve deficit.  Skin: Skin is warm. She is not diaphoretic.  Psychiatric: She has a normal mood and affect.     ASSESSMENT/PLAN   50 yo AAF with mild intermittent uncomplicated ASTHMA with alergic rhinitis with possible underlying OSA  1.check CXR 2 view 2.check PFT with  3.will order sleep study 4.continue advair, albuterol as needed 5.contihue nasal spray and antihistamine  Follow up after tests have been completed   The Patient  requires high complexity decision making for assessment and support, frequent evaluation and titration of therapies, application of advanced monitoring technologies and extensive interpretation of multiple databases.  Patient satisfied with Plan of action and management. All questions answered  Corrin Parker, M.D.  [facility]  Medical Director [place] Director [facility] Cardio-Pulmonary Department

## 2016-07-30 ENCOUNTER — Ambulatory Visit (INDEPENDENT_AMBULATORY_CARE_PROVIDER_SITE_OTHER): Payer: 59 | Admitting: *Deleted

## 2016-07-30 DIAGNOSIS — J4520 Mild intermittent asthma, uncomplicated: Secondary | ICD-10-CM

## 2016-07-30 DIAGNOSIS — R0602 Shortness of breath: Secondary | ICD-10-CM

## 2016-07-30 LAB — PULMONARY FUNCTION TEST
DL/VA % PRED: 126 %
DL/VA: 5.27 ml/min/mmHg/L
DLCO UNC: 17.81 ml/min/mmHg
DLCO unc % pred: 97 %
FEF 25-75 POST: 2.11 L/s
FEF 25-75 Pre: 1.69 L/sec
FEF2575-%CHANGE-POST: 24 %
FEF2575-%PRED-POST: 94 %
FEF2575-%Pred-Pre: 76 %
FEV1-%Change-Post: 8 %
FEV1-%PRED-PRE: 81 %
FEV1-%Pred-Post: 88 %
FEV1-Post: 1.75 L
FEV1-Pre: 1.61 L
FEV1FVC-%CHANGE-POST: -6 %
FEV1FVC-%PRED-PRE: 102 %
FEV6-%Change-Post: 16 %
FEV6-%Pred-Post: 93 %
FEV6-%Pred-Pre: 80 %
FEV6-PRE: 1.91 L
FEV6-Post: 2.23 L
FEV6FVC-%PRED-POST: 103 %
FEV6FVC-%Pred-Pre: 103 %
FVC-%CHANGE-POST: 16 %
FVC-%PRED-POST: 90 %
FVC-%PRED-PRE: 77 %
FVC-POST: 2.23 L
FVC-PRE: 1.91 L
POST FEV1/FVC RATIO: 78 %
PRE FEV1/FVC RATIO: 84 %
PRE FEV6/FVC RATIO: 100 %
Post FEV6/FVC ratio: 100 %
RV % pred: 119 %
RV: 1.82 L
TLC % pred: 91 %
TLC: 4.02 L

## 2016-07-30 NOTE — Progress Notes (Signed)
SMW performed today. 

## 2016-07-30 NOTE — Progress Notes (Signed)
PFT performed today. 

## 2016-08-05 ENCOUNTER — Encounter: Payer: Self-pay | Admitting: Internal Medicine

## 2016-08-05 ENCOUNTER — Ambulatory Visit (INDEPENDENT_AMBULATORY_CARE_PROVIDER_SITE_OTHER): Payer: 59 | Admitting: Internal Medicine

## 2016-08-05 VITALS — BP 118/84 | HR 80 | Wt 142.0 lb

## 2016-08-05 DIAGNOSIS — J4520 Mild intermittent asthma, uncomplicated: Secondary | ICD-10-CM | POA: Diagnosis not present

## 2016-08-05 NOTE — Progress Notes (Signed)
[facility] Consultation      Date: 08/05/2016,   MRN# [id number] Briana [name] 16-Jul-1966 Code Status:  Code Status History    This patient does not have a recorded code status. Please follow your organizational policy for patients in this situation.     Hosp day:@LENGTHOFSTAYDAYS @ Referring MD: @ATDPROV @     PCP:      Admission                  Current  Briana [name] is a 50 y.o. old female seen in consultation for SOB at the request of patient.    PFT'S 07/2016 RATIO 82% FEV1 81% DLCO 85% Fef25/75 midl small airways obstructive disease  6MWT 1410 FEET 97%    CHIEF COMPLAINT:   Follow up ASTHMA   HISTORY OF PRESENT ILLNESS  No SOb, no wheezing, feels better since allergy season is over I have reviewed PFT;s with patient and discussed CXR results  Patient has been using advair as needed and not taking daily Feels better with advair as needed  patient dx with ASTHMA when she was 50 years old No frequent ER visits, no intubations, no prednisone in years Patient has allergic rhinitis that is controlled with antihistamine  No signs of infection at this time  Sleep study pending     Current Medication:  Current Outpatient Prescriptions:  .  amLODipine (NORVASC) 10 MG tablet, TAKE 1 TABLET (10 MG TOTAL) BY MOUTH DAILY., Disp: , Rfl: 0 .  azelastine (ASTELIN) 0.1 % nasal spray, SPRAY ONCE IN EACH NOSTRIL TWICE DAILY, Disp: , Rfl: 12 .  Fluticasone-Salmeterol (ADVAIR DISKUS) 250-50 MCG/DOSE AEPB, INHALE 1 PUFF 2 TIMES A DAY. Appointment necessary for further refills., Disp: , Rfl:  .  loratadine (CLARITIN) 10 MG tablet, Take 10 mg by mouth., Disp: , Rfl:  .  QNASL 80 MCG/ACT AERS, Place 2 sprays into both nostrils daily., Disp: , Rfl: 12    ALLERGIES   Review of patient's allergies indicates no known allergies.     REVIEW OF SYSTEMS   Review of Systems  Constitutional: Negative for chills, diaphoresis, fever, malaise/fatigue  and weight loss.  HENT: Negative for congestion.   Eyes: Negative for blurred vision and double vision.  Respiratory: Negative for cough, hemoptysis, sputum production, shortness of breath and wheezing.   Cardiovascular: Negative for chest pain, palpitations, orthopnea and leg swelling.  Gastrointestinal: Negative for heartburn and nausea.  Musculoskeletal: Negative for neck pain.  Neurological: Negative for weakness.  All other systems reviewed and are negative.    VS:   BP 118/84 (BP Location: Left Arm, Cuff Size: Normal)   Pulse 80   Wt 142 lb (64.4 kg)   SpO2 97%   BMI 28.20 kg/m     PHYSICAL EXAM  Physical Exam  Constitutional: She is oriented to person, place, and time. She appears well-developed and well-nourished. No distress.  Neck: Neck supple.  Cardiovascular: Normal rate, regular rhythm and normal heart sounds.   No murmur heard. Pulmonary/Chest: Effort normal and breath sounds normal. No stridor. No respiratory distress. She has no wheezes. She has no rales.  Musculoskeletal: Normal range of motion. She exhibits no edema.  Neurological: She is alert and oriented to person, place, and time. No cranial nerve deficit.  Skin: Skin is warm. She is not diaphoretic.  Psychiatric: She has a normal mood and affect.    CXR 2 VIEW IMAGES REVIEWED 08/05/2016    ASSESSMENT/PLAN   50 yo AAF with  mild intermittent uncomplicated ASTHMA with alergic rhinitis with possible underlying OSA  1. albuterol as needed, hold off on advair use for now 2..contihue nasal spray and antihistamine 3.follow up ENT-patient to set up appointment 4.sleep study pending  Follow up in 3 months   The Patient requires high complexity decision making for assessment and support, frequent evaluation and titration of therapies, application of advanced monitoring technologies and extensive interpretation of multiple databases.  Patient satisfied with Plan of action and management. All questions  answered  Corrin Parker, M.D.  [facility]  Medical Director [place] Director [facility] Cardio-Pulmonary Department

## 2016-08-05 NOTE — Patient Instructions (Signed)
Follow up 2 months Follow up with ENT-patient to set up appointment ALbuterol as needed Advair on hold for now Continue Claritin and nasal sprays    Bronchospasm, Adult A bronchospasm is a spasm or tightening of the airways going into the lungs. During a bronchospasm breathing becomes more difficult because the airways get smaller. When this happens there can be coughing, a whistling sound when breathing (wheezing), and difficulty breathing. Bronchospasm is often associated with asthma, but not all patients who experience a bronchospasm have asthma. CAUSES  A bronchospasm is caused by inflammation or irritation of the airways. The inflammation or irritation may be triggered by:   Allergies (such as to animals, pollen, food, or mold). Allergens that cause bronchospasm may cause wheezing immediately after exposure or many hours later.   Infection. Viral infections are believed to be the most common cause of bronchospasm.   Exercise.   Irritants (such as pollution, cigarette smoke, strong odors, aerosol sprays, and paint fumes).   Weather changes. Winds increase molds and pollens in the air. Rain refreshes the air by washing irritants out. Cold air may cause inflammation.   Stress and emotional upset.  SIGNS AND SYMPTOMS   Wheezing.   Excessive nighttime coughing.   Frequent or severe coughing with a simple cold.   Chest tightness.   Shortness of breath.  DIAGNOSIS  Bronchospasm is usually diagnosed through a history and physical exam. Tests, such as chest X-rays, are sometimes done to look for other conditions. TREATMENT   Inhaled medicines can be given to open up your airways and help you breathe. The medicines can be given using either an inhaler or a nebulizer machine.  Corticosteroid medicines may be given for severe bronchospasm, usually when it is associated with asthma. HOME CARE INSTRUCTIONS   Always have a plan prepared for seeking medical care. Know when  to call your health care provider and local emergency services (911 in the [place] Know where you can access local emergency care.  Only take medicines as directed by your health care provider.  If you were prescribed an inhaler or nebulizer machine, ask your health care provider to explain how to use it correctly. Always use a spacer with your inhaler if you were given one.  It is necessary to remain calm during an attack. Try to relax and breathe more slowly.  Control your home environment in the following ways:   Change your heating and air conditioning filter at least once a month.   Limit your use of fireplaces and wood stoves.  Do not smoke and do not allow smoking in your home.   Avoid exposure to perfumes and fragrances.   Get rid of pests (such as roaches and mice) and their droppings.   Throw away plants if you see mold on them.   Keep your house clean and dust free.   Replace carpet with wood, tile, or vinyl flooring. Carpet can trap dander and dust.   Use allergy-proof pillows, mattress covers, and box spring covers.   Wash bed sheets and blankets every week in hot water and dry them in a dryer.   Use blankets that are made of polyester or cotton.   Wash hands frequently. SEEK MEDICAL CARE IF:   You have muscle aches.   You have chest pain.   The sputum changes from clear or white to yellow, green, gray, or bloody.   The sputum you cough up gets thicker.   There are problems that may be related to  the medicine you are given, such as a rash, itching, swelling, or trouble breathing.  SEEK IMMEDIATE MEDICAL CARE IF:   You have worsening wheezing and coughing even after taking your prescribed medicines.   You have increased difficulty breathing.   You develop severe chest pain. MAKE SURE YOU:   Understand these instructions.  Will watch your condition.  Will get help right away if you are not doing well or get worse.   This  information is not intended to replace advice given to you by your health care provider. Make sure you discuss any questions you have with your health care provider.   Document Released: 12/05/2003 Document Revised: 12/23/2014 Document Reviewed: 05/24/2013 Elsevier Interactive Patient Education [employer].

## 2016-08-06 ENCOUNTER — Telehealth: Payer: Self-pay | Admitting: Obstetrics and Gynecology

## 2016-08-06 NOTE — Telephone Encounter (Signed)
Pt cancelled appt bc she started her period. She is having a discharge and odor. There is nothing available until 9/19. She said she would be on her period again. Can we fit her in sometime sooner than 9/19

## 2016-08-07 ENCOUNTER — Ambulatory Visit: Payer: 59 | Admitting: Obstetrics and Gynecology

## 2016-08-08 NOTE — Telephone Encounter (Signed)
If she is ok seeing a man she can see [place], we can see if she can come today on his schedule if C. Sabra Heck is ok with that

## 2016-08-09 ENCOUNTER — Ambulatory Visit: Payer: 59 | Attending: Pulmonary Disease

## 2016-08-09 DIAGNOSIS — G4730 Sleep apnea, unspecified: Secondary | ICD-10-CM | POA: Diagnosis present

## 2016-08-09 DIAGNOSIS — G4733 Obstructive sleep apnea (adult) (pediatric): Secondary | ICD-10-CM | POA: Diagnosis not present

## 2016-08-13 DIAGNOSIS — G4733 Obstructive sleep apnea (adult) (pediatric): Secondary | ICD-10-CM | POA: Diagnosis not present

## 2016-09-10 ENCOUNTER — Ambulatory Visit (INDEPENDENT_AMBULATORY_CARE_PROVIDER_SITE_OTHER): Payer: 59 | Admitting: Obstetrics and Gynecology

## 2016-09-10 VITALS — BP 124/83 | HR 84 | Ht 59.5 in | Wt 144.3 lb

## 2016-09-10 DIAGNOSIS — B9689 Other specified bacterial agents as the cause of diseases classified elsewhere: Secondary | ICD-10-CM

## 2016-09-10 DIAGNOSIS — N951 Menopausal and female climacteric states: Secondary | ICD-10-CM

## 2016-09-10 DIAGNOSIS — N760 Acute vaginitis: Secondary | ICD-10-CM

## 2016-09-10 DIAGNOSIS — A499 Bacterial infection, unspecified: Secondary | ICD-10-CM

## 2016-09-10 DIAGNOSIS — Z113 Encounter for screening for infections with a predominantly sexual mode of transmission: Secondary | ICD-10-CM

## 2016-09-10 DIAGNOSIS — N926 Irregular menstruation, unspecified: Secondary | ICD-10-CM | POA: Diagnosis not present

## 2016-09-10 MED ORDER — METRONIDAZOLE 500 MG PO TABS
500.0000 mg | ORAL_TABLET | Freq: Two times a day (BID) | ORAL | 0 refills | Status: DC
Start: 2016-09-10 — End: 2017-07-25

## 2016-09-10 NOTE — Progress Notes (Addendum)
    GYNECOLOGY PROGRESS NOTE  Subjective:    Patient ID: Briana [name], female    DOB: January 25, 1966, 50 y.o.   MRN: [id number]  HPI  Patient is a 50 y.o. [id number] female who presents for complaints of possible vaginal infection.  Notes vaginal odor x 1 month.  Also desires STD testing as she recently ended a relationship and wants to make sure that everything is ok.    In addition, patient notes that she has been having hot flushes (lastin 2 weeks) and an irregular period in August.  Notes that she stopped her progesterone OCPs in June (states she felt she did not need them after her relationship ended). Notes a regular cycle in July, but in August her cycle lasted 3 weeks.  Is due for her next cycle within this week. H  The following portions of the patient's history were reviewed and updated as appropriate: allergies, current medications, past family history, past medical history, past social history, past surgical history and problem list.  Review of Systems Pertinent items noted in HPI and remainder of comprehensive ROS otherwise negative.   Objective:   Blood pressure 124/83, pulse 84, height 4' 11.5" (1.511 m), weight 144 lb 4.8 oz (65.5 kg), last menstrual period 08/05/2016. General appearance: alert and no distress Abdomen: soft, non-tender; bowel sounds normal; no masses,  no organomegaly Pelvic: external genitalia normal, rectovaginal septum normal.  Vagina with moderate amount of thin white discharge.  Cervix normal appearing, no lesions and no motion tenderness.  Uterus mobile, nontender, normal shape and size.  Adnexae non-palpable, nontender bilaterally.  Extremities: extremities normal, atraumatic, no cyanosis or edema Neurologic: Grossly normal   Microscopic wet-mount exam shows clue cells.  No trichomonads or yeast. No WBCs or RBCs. KOH done. + whiff test   Assessment:   Bacterial vaginosis Irregular menses Hot flushes Desires STD testing  Plan:   - Will treat  bacterial vaginosis with Flagyl 500 mg BID. STD testing performed at patient's request.  - Irregular menses, possibly secondary to discontinuing OCPs, may be also affected by patient is in perimenopausal status.  Discussed that she can wait until next cycle to see if irregular bleeding continues as it happened once.  If so, she would likely need to resume pills.  - Hot flushes, likely secondary to perimenopausal status.  Would likely resolve if patient resumed OCPs for irregular bleeding. Also discussed lifestyle modifications.  If no relief after resuming OCPs, or patient decides not to resume, can discuss alternative treatment methods including HRT or non-hormonal methods.  - To return if symptoms worsen.    Rubie Maid, MD [facility]

## 2016-09-11 LAB — HEP, RPR, HIV PANEL
HEP B S AG: NEGATIVE
HIV Screen 4th Generation wRfx: NONREACTIVE
RPR Ser Ql: NONREACTIVE

## 2016-09-11 LAB — HEPATITIS C ANTIBODY: Hep C Virus Ab: <0.1 s/co ratio (ref 0.0–0.9)

## 2016-09-12 LAB — GC/CHLAMYDIA PROBE AMP
Chlamydia trachomatis, NAA: NEGATIVE
Neisseria gonorrhoeae by PCR: NEGATIVE

## 2016-09-13 ENCOUNTER — Telehealth: Payer: Self-pay

## 2016-09-13 NOTE — Telephone Encounter (Signed)
Called pt informed her of normal results.

## 2016-09-13 NOTE — Telephone Encounter (Signed)
-----   Message from Rubie Maid, MD sent at 09/12/2016  3:22 PM EDT ----- Please inform of negative STD screen.

## 2016-09-20 ENCOUNTER — Other Ambulatory Visit: Payer: Self-pay | Admitting: Otolaryngology

## 2016-09-20 DIAGNOSIS — H93A9 Pulsatile tinnitus, unspecified ear: Secondary | ICD-10-CM

## 2016-10-02 ENCOUNTER — Ambulatory Visit
Admission: RE | Admit: 2016-10-02 | Discharge: 2016-10-02 | Disposition: A | Discharge status: Home / Self Care | Payer: 59 | Source: Ambulatory Visit | Attending: Otolaryngology | Admitting: Otolaryngology

## 2016-10-02 DIAGNOSIS — H93A9 Pulsatile tinnitus, unspecified ear: Secondary | ICD-10-CM | POA: Diagnosis not present

## 2016-10-02 MED ORDER — GADOBENATE DIMEGLUMINE 529 MG/ML IV SOLN
15.0000 mL | Freq: Once | INTRAVENOUS | Status: AC | PRN
  Administered 2016-10-02: 13 mL via INTRAVENOUS

## 2016-10-07 ENCOUNTER — Ambulatory Visit: Payer: 59 | Admitting: Internal Medicine

## 2016-10-23 ENCOUNTER — Ambulatory Visit: Payer: 59 | Admitting: Internal Medicine

## 2016-11-04 ENCOUNTER — Ambulatory Visit (INDEPENDENT_AMBULATORY_CARE_PROVIDER_SITE_OTHER): Payer: 59 | Admitting: Internal Medicine

## 2016-11-04 ENCOUNTER — Encounter: Payer: Self-pay | Admitting: Internal Medicine

## 2016-11-04 VITALS — BP 124/72 | HR 63 | Ht 59.5 in | Wt 146.0 lb

## 2016-11-04 DIAGNOSIS — J4520 Mild intermittent asthma, uncomplicated: Secondary | ICD-10-CM

## 2016-11-04 MED ORDER — FLUTICASONE-SALMETEROL 100-50 MCG/DOSE IN AEPB: 1.0000 | INHALATION_SPRAY | Freq: Two times a day (BID) | RESPIRATORY_TRACT | 1 refills | Status: DC

## 2016-11-04 NOTE — Patient Instructions (Addendum)
START ADVAIR 100/50 twice daily STOP ADVAIR 200/50 Albuterol as needed Avoid second hand smoke Check ONO    Bronchospasm, Adult A bronchospasm is when the tubes that carry air in and out of your lungs (airways) spasm or tighten. During a bronchospasm it is hard to breathe. This is because the airways get smaller. A bronchospasm can be triggered by:  Allergies. These may be to animals, pollen, food, or mold.  Infection. This is a common cause of bronchospasm.  Exercise.  Irritants. These include pollution, cigarette smoke, strong odors, aerosol sprays, and paint fumes.  Weather changes.  Stress.  Being emotional. Follow these instructions at home:  Always have a plan for getting help. Know when to call your doctor and local emergency services (911 in the [place] Know where you can get emergency care.  Only take medicines as told by your doctor.  If you were prescribed an inhaler or nebulizer machine, ask your doctor how to use it correctly. Always use a spacer with your inhaler if you were given one.  Stay calm during an attack. Try to relax and breathe more slowly.  Control your home environment:  Change your heating and air conditioning filter at least once a month.  Limit your use of fireplaces and wood stoves.  Do not  smoke. Do not  allow smoking in your home.  Avoid perfumes and fragrances.  Get rid of pests (such as roaches and mice) and their droppings.  Throw away plants if you see mold on them.  Keep your house clean and dust free.  Replace carpet with wood, tile, or vinyl flooring. Carpet can trap dander and dust.  Use allergy-proof pillows, mattress covers, and box spring covers.  Wash bed sheets and blankets every week in hot water. Dry them in a dryer.  Use blankets that are made of polyester or cotton.  Wash hands frequently. Contact a doctor if:  You have muscle aches.  You have chest pain.  The thick spit you spit or cough up (sputum)  changes from clear or white to yellow, green, gray, or bloody.  The thick spit you spit or cough up gets thicker.  There are problems that may be related to the medicine you are given such as:  A rash.  Itching.  Swelling.  Trouble breathing. Get help right away if:  You feel you cannot breathe or catch your breath.  You cannot stop coughing.  Your treatment is not helping you breathe better.  You have very bad chest pain. This information is not intended to replace advice given to you by your health care provider. Make sure you discuss any questions you have with your health care provider. Document Released: 09/29/2009 Document Revised: 05/09/2016 Document Reviewed: 05/25/2013 Elsevier Interactive Patient Education  2017 [name].

## 2016-11-04 NOTE — Progress Notes (Signed)
Briana [name]      Date: 11/04/2016,   MRN# [id number] MARGEAN LEONARDIS [birth year] Code Status:  Code Status History    This patient does not have a recorded code status. Please follow your organizational policy for patients in this situation.     Hosp day:@LENGTHOFSTAYDAYS @ Referring MD: @ATDPROV @     PCP:      Admission                  Current  Briana [name] is a 50 y.o. old female seen in [name] for SOB at the request of patient.    PFT'S 07/2016 RATIO 82% FEV1 81% DLCO 85% Fef25/75 midl small airways obstructive disease  6MWT 1410 FEET 97%    CHIEF COMPLAINT:   Follow up ASTHMA   HISTORY OF PRESENT ILLNESS  No SOb, no wheezing, feels better since allergy season is over  Patient started using her advair daily Feels better with advair   patient dx with ASTHMA when she was 50 years old No frequent ER visits, no intubations, no prednisone in years Patient has allergic rhinitis that is controlled with antihistamine  No signs of infection at this time  Sleep study shows mild OSA index of 2.8, 117 snoring episodes Drop in 02 sats      Current Medication:  Current Outpatient Prescriptions:  .  amLODipine (NORVASC) 10 MG tablet, TAKE 1 TABLET (10 MG TOTAL) BY MOUTH DAILY., Disp: , Rfl: 0 .  azelastine (ASTELIN) 0.1 % nasal spray, SPRAY ONCE IN EACH NOSTRIL TWICE DAILY, Disp: , Rfl: 12 .  Fluticasone-Salmeterol (ADVAIR DISKUS) 250-50 MCG/DOSE AEPB, INHALE 1 PUFF 2 TIMES A DAY. Appointment necessary for further refills., Disp: , Rfl:  .  loratadine (CLARITIN) 10 MG tablet, Take 10 mg by mouth., Disp: , Rfl:  .  metroNIDAZOLE (FLAGYL) 500 MG tablet, Take 1 tablet (500 mg total) by mouth 2 (two) times daily., Disp: 14 tablet, Rfl: 0 .  QNASL 80 MCG/ACT AERS, Place 2 sprays into both nostrils daily., Disp: , Rfl: 12    ALLERGIES   Patient has no known allergies.     REVIEW OF SYSTEMS   Review of Systems    Constitutional: Negative for chills, diaphoresis, fever, malaise/fatigue and weight loss.  HENT: Negative for congestion.   Eyes: Negative for blurred vision and double vision.  Respiratory: Negative for cough, hemoptysis, sputum production, shortness of breath and wheezing.   Cardiovascular: Negative for chest pain, palpitations, orthopnea and leg swelling.  Gastrointestinal: Negative for heartburn and nausea.  Musculoskeletal: Negative for neck pain.  Neurological: Negative for weakness.  All other systems reviewed and are negative.    BP 124/72 (BP Location: Left Arm, Cuff Size: Normal)   Pulse 63   Ht 4' 11.5" (1.511 m)   Wt 146 lb (66.2 kg)   SpO2 98%   BMI 28.99 kg/m    PHYSICAL EXAM  Physical Exam  Constitutional: She is oriented to person, place, and time. She appears well-developed and well-nourished. No distress.  Neck: Neck supple.  Cardiovascular: Normal rate, regular rhythm and normal heart sounds.   No murmur heard. Pulmonary/Chest: Effort normal and breath sounds normal. No stridor. No respiratory distress. She has no wheezes. She has no rales.  Musculoskeletal: Normal range of motion. She exhibits no edema.  Neurological: She is alert and oriented to person, place, and time. No cranial nerve deficit.  Skin: Skin is warm. She is not diaphoretic.  Psychiatric: She has a normal  mood and affect.      ASSESSMENT/PLAN   50 yo AAF with mild intermittent uncomplicated ASTHMA with alergic rhinitis with MILD OSA She has started to use her advair and her breathing is much improved  1.albuterol as needed 2.start advair 100/50 BID and stop advair 250 dose and assess resp status 3.contihue nasal spray and antihistamine 4.follow up ENT 5.check ONO 6.recommend weight loss, recommend avoiding supine position when sleeping  Follow up in 3 months   The Patient requires high complexity decision making for assessment and support, frequent evaluation and titration of  therapies, application of advanced monitoring technologies and extensive interpretation of multiple databases.  Patient satisfied with Plan of action and management. All questions answered  Corrin Parker, M.D.  [facility]  Medical Director [place] Director [facility] Cardio-Pulmonary Department

## 2016-11-15 ENCOUNTER — Encounter: Payer: Self-pay | Admitting: Internal Medicine

## 2016-11-15 DIAGNOSIS — G4730 Sleep apnea, unspecified: Secondary | ICD-10-CM

## 2016-12-04 ENCOUNTER — Telehealth: Payer: Self-pay

## 2016-12-04 DIAGNOSIS — J45909 Unspecified asthma, uncomplicated: Secondary | ICD-10-CM

## 2016-12-04 NOTE — Telephone Encounter (Signed)
Lmtcb. Will await call back 

## 2016-12-04 NOTE — Telephone Encounter (Signed)
Pt aware of need to repeat ONO. Ordered placed. Pt aware and voiced understanding. Nothing further needed.

## 2016-12-19 ENCOUNTER — Encounter: Payer: Self-pay | Admitting: Internal Medicine

## 2016-12-19 DIAGNOSIS — J45909 Unspecified asthma, uncomplicated: Secondary | ICD-10-CM

## 2016-12-23 ENCOUNTER — Telehealth: Payer: Self-pay | Admitting: *Deleted

## 2016-12-23 NOTE — Telephone Encounter (Signed)
LMOM for pt to return call for ONO results. 

## 2016-12-26 NOTE — Telephone Encounter (Signed)
Pt informed pt. Nothing further needed.

## 2017-01-31 ENCOUNTER — Telehealth: Payer: Self-pay | Admitting: Internal Medicine

## 2017-01-31 MED ORDER — FLUTICASONE-SALMETEROL 100-50 MCG/DOSE IN AEPB: 1.0000 | INHALATION_SPRAY | Freq: Two times a day (BID) | RESPIRATORY_TRACT | 5 refills | Status: DC

## 2017-01-31 NOTE — Telephone Encounter (Signed)
Advair sent to pharmacy with refills. Nothing further needed.

## 2017-01-31 NOTE — Telephone Encounter (Signed)
Pt calling to state she would like [place] to please call in the inhaler  for Advair To [facility] on [address]  Please call if we have any issues

## 2017-04-23 ENCOUNTER — Other Ambulatory Visit: Payer: Self-pay | Admitting: Obstetrics and Gynecology

## 2017-07-25 ENCOUNTER — Encounter: Payer: Self-pay | Admitting: Obstetrics and Gynecology

## 2017-07-25 ENCOUNTER — Ambulatory Visit (INDEPENDENT_AMBULATORY_CARE_PROVIDER_SITE_OTHER): Payer: 59 | Admitting: Obstetrics and Gynecology

## 2017-07-25 VITALS — BP 135/81 | HR 105 | Ht 59.5 in | Wt 149.1 lb

## 2017-07-25 DIAGNOSIS — N760 Acute vaginitis: Secondary | ICD-10-CM | POA: Diagnosis not present

## 2017-07-25 DIAGNOSIS — B9689 Other specified bacterial agents as the cause of diseases classified elsewhere: Secondary | ICD-10-CM | POA: Diagnosis not present

## 2017-07-25 DIAGNOSIS — N951 Menopausal and female climacteric states: Secondary | ICD-10-CM

## 2017-07-25 MED ORDER — METRONIDAZOLE 500 MG PO TABS: 500.0000 mg | ORAL_TABLET | Freq: Two times a day (BID) | ORAL | 0 refills | Status: DC

## 2017-07-25 NOTE — Progress Notes (Signed)
    GYNECOLOGY PROGRESS NOTE  Subjective:    Patient ID: Briana [name], female    DOB: [birth year], 51 y.o.   MRN: [id number]  HPI  Patient is a 51 y.o. [id number] female who presents for complaints of a "warm sensation" radiating from the abdomen/pelvis over the past few months. Notes that it feels somewhat like a hot flash, but does not feel warmth in her head and face, only below her waist.  Is also noting that she is experiencing vaginal infections almost every 3-6 months.  States that she was treated with antibiotics in May for sinusitis, and developed a yeast infection shortly after that, which was treated by her PCP. Now is starting to have some mild vaginal symptoms again (has been occurring over the past few days), with irritation and odor.  Denies vaginal discharge, abnormal bleeding.   The following portions of the patient's history were reviewed and updated as appropriate: allergies, current medications, past family history, past medical history, past social history, past surgical history and problem list.   Review of Systems Pertinent items noted in HPI and remainder of comprehensive ROS otherwise negative.   Objective:   Blood pressure 135/81, pulse (!) 105, height 4' 11.5" (1.511 m), weight 149 lb 1.6 oz (67.6 kg), last menstrual period 07/06/2017. General appearance: alert and no distress Abdomen: soft, non-tender; bowel sounds normal; no masses,  no organomegaly Pelvic: external genitalia normal, rectovaginal septum normal.  Vagina with small amount of thin white discharge.  Cervix normal appearing, no lesions and no motion tenderness.  Uterus mobile, nontender, normal shape and size.  Adnexae non-palpable, nontender bilaterally.  Extremities: extremities normal, atraumatic, no cyanosis or edema Neurologic: Grossly normal   Microscopic wet-mount exam shows, numerous clue cells and bacteria.  No RBCs, hyphae, trichomonads. KOH done.   Assessment:   Perimenopausal vasomotor  symptoms Bacterial vaginosis Recurrent vaginitis  Plan:   1. Perimenopausal vasomotor symptoms - discussed options for management of vasomotor symptoms, including estrogen-base therapy, over the counter herbal supplements, and non-estrogen alternative therapies (SSRI's, Clonidine, Gabapentin).  Also discussed lifestyle changes including layering clothes, increasing hydration (cold fluids), fans in rooms.  Patient would like to consider over the counter herbal supplements first.  Given list of options.  2. Current wet prep consistent with bacterial vaginosis.  Will prescribe Flagyl.  3. Recurrent vaginitis - discussed preventative measures, including eating a healthy diet rich in fruits and vegetables and low in sugar and refined carbohydrates, supporting her immune system by reducing stress, getting adequate sleep and exercise regularly, eating plain, probiotic yogurt daily or take a daily vaginal probiotic,  avoiding harsh soaps, and detergents.  Can use vaginal washes like Vagisil or Summer's Eve. Also avoiding routine douching, and if she notices that sex can trigger infections for her, to use condoms.    Rubie Maid, MD [facility]

## 2017-07-25 NOTE — Patient Instructions (Addendum)
PREVENTION OF RECURRENT VAGINITIS:   1. First and foremost, eat a healthy diet rich in fruits and vegetables and low in sugar and refined carbohydrates. Studies have shown that yeast and harmful bacteria are more likely to be a problem in people with high sugar diets. One food believed to be particularly helpful is garlic. 2. Support your immune system: reduce stress, get adequate sleep and exercise regularly. 3. Eat plain, probiotic yogurt daily or take a daily vaginal probiotic (can be found in the feminine aisle at any pharmacy) containing Acidophillus (they should contain at least 1 billion CFU's- this is information found on the bottle).  4. Avoid harsh soaps, and detergents.  Can use vaginal washes like Vagisil or Summer's Eve.  5.Avoid routine douching, and if you notice that sex can trigger infections for you, use condoms. For some women who get infections easily, both douching and the alkaline nature of semen can disrupt the vaginal pH.      Bacterial Vaginosis Bacterial vaginosis is a vaginal infection that occurs when the normal balance of bacteria in the vagina is disrupted. It results from an overgrowth of certain bacteria. This is the most common vaginal infection among women ages 70-44. Because bacterial vaginosis increases your risk for STIs (sexually transmitted infections), getting treated can help reduce your risk for chlamydia, gonorrhea, herpes, and HIV (human immunodeficiency virus). Treatment is also important for preventing complications in pregnant women, because this condition can cause an early (premature) delivery. What are the causes? This condition is caused by an increase in harmful bacteria that are normally present in small amounts in the vagina. However, the reason that the condition develops is not fully understood. What increases the risk? The following factors may make you more likely to develop this condition:  Having a new sexual partner or multiple sexual  partners.  Having unprotected sex.  Douching.  Having an intrauterine device (IUD).  Smoking.  Drug and alcohol abuse.  Taking certain antibiotic medicines.  Being pregnant.  You cannot get bacterial vaginosis from toilet seats, bedding, swimming pools, or contact with objects around you. What are the signs or symptoms? Symptoms of this condition include:  Grey or white vaginal discharge. The discharge can also be watery or foamy.  A fish-like odor with discharge, especially after sexual intercourse or during menstruation.  Itching in and around the vagina.  Burning or pain with urination.  Some women with bacterial vaginosis have no signs or symptoms. How is this diagnosed? This condition is diagnosed based on:  Your medical history.  A physical exam of the vagina.  Testing a sample of vaginal fluid under a microscope to look for a large amount of bad bacteria or abnormal cells. Your health care provider may use a cotton swab or a small wooden spatula to collect the sample.  How is this treated? This condition is treated with antibiotics. These may be given as a pill, a vaginal cream, or a medicine that is put into the vagina (suppository). If the condition comes back after treatment, a second round of antibiotics may be needed. Follow these instructions at home: Medicines  Take over-the-counter and prescription medicines only as told by your health care provider.  Take or use your antibiotic as told by your health care provider. Do not stop taking or using the antibiotic even if you start to feel better. General instructions  If you have a female sexual partner, tell her that you have a vaginal infection. She should see her health  care provider and be treated if she has symptoms. If you have a female sexual partner, he does not need treatment.  During treatment: ? Avoid sexual activity until you finish treatment. ? Do not douche. ? Avoid alcohol as directed by  your health care provider. ? Avoid breastfeeding as directed by your health care provider.  Drink enough water and fluids to keep your urine clear or pale yellow.  Keep the area around your vagina and rectum clean. ? Wash the area daily with warm water. ? Wipe yourself from front to back after using the toilet.  Keep all follow-up visits as told by your health care provider. This is important. How is this prevented?  Do not douche.  Wash the outside of your vagina with warm water only.  Use protection when having sex. This includes latex condoms and dental dams.  Limit how many sexual partners you have. To help prevent bacterial vaginosis, it is best to have sex with just one partner (monogamous).  Make sure you and your sexual partner are tested for STIs.  Wear cotton or cotton-lined underwear.  Avoid wearing tight pants and pantyhose, especially during summer.  Limit the amount of alcohol that you drink.  Do not use any products that contain nicotine or tobacco, such as cigarettes and e-cigarettes. If you need help quitting, ask your health care provider.  Do not use illegal drugs. Where to find more information:  Centers for Disease Control and Prevention: AppraiserFraud.fi  American Sexual Health Association (ASHA): www.ashastd.org  U.S. Department of Health and Financial controller, Office on Women's Health: DustingSprays.pl or SecuritiesCard.it Contact a health care provider if:  Your symptoms do not improve, even after treatment.  You have more discharge or pain when urinating.  You have a fever.  You have pain in your abdomen.  You have pain during sex.  You have vaginal bleeding between periods. Summary  Bacterial vaginosis is a vaginal infection that occurs when the normal balance of bacteria in the vagina is disrupted.  Because bacterial vaginosis increases your risk for STIs (sexually transmitted infections),  getting treated can help reduce your risk for chlamydia, gonorrhea, herpes, and HIV (human immunodeficiency virus). Treatment is also important for preventing complications in pregnant women, because the condition can cause an early (premature) delivery.  This condition is treated with antibiotic medicines. These may be given as a pill, a vaginal cream, or a medicine that is put into the vagina (suppository). This information is not intended to replace advice given to you by your health care provider. Make sure you discuss any questions you have with your health care provider. Document Released: 12/02/2005 Document Revised: 08/17/2016 Document Reviewed: 08/17/2016 Elsevier Interactive Patient Education  2017 [name].

## 2017-07-27 DIAGNOSIS — N951 Menopausal and female climacteric states: Secondary | ICD-10-CM | POA: Insufficient documentation

## 2017-11-20 ENCOUNTER — Encounter: Payer: Self-pay | Admitting: Internal Medicine

## 2017-11-20 ENCOUNTER — Ambulatory Visit: Payer: 59 | Admitting: Internal Medicine

## 2017-11-20 VITALS — BP 130/100 | HR 86 | Ht 59.0 in | Wt 151.0 lb

## 2017-11-20 DIAGNOSIS — J4520 Mild intermittent asthma, uncomplicated: Secondary | ICD-10-CM | POA: Diagnosis not present

## 2017-11-20 DIAGNOSIS — G4719 Other hypersomnia: Secondary | ICD-10-CM | POA: Diagnosis not present

## 2017-11-20 MED ORDER — FLUTICASONE-SALMETEROL 100-50 MCG/DOSE IN AEPB: 1.0000 | INHALATION_SPRAY | Freq: Two times a day (BID) | RESPIRATORY_TRACT | 6 refills | Status: AC

## 2017-11-20 NOTE — Patient Instructions (Signed)
Obtain split night sleep study Avoid triggers

## 2017-11-20 NOTE — Addendum Note (Signed)
Addended by: Renelda Mom on: 11/20/2017 09:34 AM   Modules accepted: Orders

## 2017-11-20 NOTE — Progress Notes (Signed)
[facility] Consultation      Date: 11/20/2017,   MRN# [id number] Briana [name] 01-29-1966 Code Status:  Code Status History    This patient does not have a recorded code status. Please follow your organizational policy for patients in this situation.       AdmissionWeight: 151 lb (68.5 kg)                 CurrentWeight: 151 lb (68.5 kg) Briana [name] is a 51 y.o. old female seen in consultation for SOB at the request of patient.    PFT'S 07/2016 RATIO 82% FEV1 81% DLCO 85% Fef25/75 midl small airways obstructive disease  6MWT 1410 FEET 97%    CHIEF COMPLAINT:   Follow up ASTHMA   HISTORY OF PRESENT ILLNESS  No SOb, no wheezing, feels better since allergy season is over  Patient started using her advair as needed Feels better with advair as needed Uses nasal sprays and antihiatmines  I reviewed Sleep study with patient She still has fatigue and daytime sleepiness  patient dx with ASTHMA when she was 51 years old No frequent ER visits, no intubations, no prednisone in years Patient has allergic rhinitis that is controlled with antihistamine  No signs of infection at this time  Sleep study shows mild OSA index of 2.8, 117 snoring episodes Drop in 02 sats      Current Medication:  Current Outpatient Medications:  .  amLODipine (NORVASC) 10 MG tablet, TAKE 1 TABLET (10 MG TOTAL) BY MOUTH DAILY., Disp: , Rfl: 0 .  Fluticasone-Salmeterol (ADVAIR DISKUS) 250-50 MCG/DOSE AEPB, , Disp: , Rfl:  .  loratadine (CLARITIN) 10 MG tablet, Take 10 mg by mouth., Disp: , Rfl:  .  QNASL 80 MCG/ACT AERS, Place 2 sprays into both nostrils daily., Disp: , Rfl: 12    ALLERGIES   Patient has no known allergies.     REVIEW OF SYSTEMS   Review of Systems  Constitutional: Negative for chills, diaphoresis, fever, malaise/fatigue and weight loss.  HENT: Negative for congestion.   Eyes: Negative for blurred vision and double vision.  Respiratory:  Negative for cough, hemoptysis, sputum production, shortness of breath and wheezing.   Cardiovascular: Negative for chest pain, palpitations, orthopnea and leg swelling.  Gastrointestinal: Negative for heartburn and nausea.  Musculoskeletal: Negative for neck pain.  Neurological: Negative for weakness.  All other systems reviewed and are negative.    BP (!) 130/100 (BP Location: Left Arm, Cuff Size: Normal)   Pulse 86   Ht 4\' 11"  (1.499 m)   Wt 151 lb (68.5 kg)   SpO2 97%   BMI 30.50 kg/m    PHYSICAL EXAM  Physical Exam  Constitutional: She is oriented to person, place, and time. She appears well-developed and well-nourished. No distress.  Neck: Neck supple.  Cardiovascular: Normal rate, regular rhythm and normal heart sounds.  No murmur heard. Pulmonary/Chest: Effort normal and breath sounds normal. No stridor. No respiratory distress. She has no wheezes. She has no rales.  Musculoskeletal: Normal range of motion. She exhibits no edema.  Neurological: She is alert and oriented to person, place, and time. No cranial nerve deficit.  Skin: Skin is warm. She is not diaphoretic.  Psychiatric: She has a normal mood and affect.      ASSESSMENT/PLAN   51 yo AAF with mild intermittent uncomplicated ASTHMA with alergic rhinitis with MILD OSA She has started to use her advair as needed and her breathing is much improved  1.albuterol as needed 2.advair 100/50 BID  3.continue nasal spray and antihistamine 4.obtain split night sleep study 6.recommend weight loss, recommend avoiding supine position when sleeping  Follow up in 3 months   Ericia Moxley [name], M.D.  [facility]  Medical Director [place] Director [facility] Cardio-Pulmonary Department

## 2018-01-01 ENCOUNTER — Encounter: Payer: Self-pay | Admitting: Internal Medicine

## 2018-01-01 DIAGNOSIS — G4719 Other hypersomnia: Secondary | ICD-10-CM

## 2018-01-01 DIAGNOSIS — G4733 Obstructive sleep apnea (adult) (pediatric): Secondary | ICD-10-CM | POA: Diagnosis not present

## 2018-01-02 DIAGNOSIS — G4733 Obstructive sleep apnea (adult) (pediatric): Secondary | ICD-10-CM | POA: Diagnosis not present

## 2018-01-08 ENCOUNTER — Telehealth: Payer: Self-pay | Admitting: *Deleted

## 2018-01-08 DIAGNOSIS — G4733 Obstructive sleep apnea (adult) (pediatric): Secondary | ICD-10-CM

## 2018-01-08 NOTE — Telephone Encounter (Signed)
Patient aware of results of sleep study. Orders placed.  

## 2018-01-13 ENCOUNTER — Ambulatory Visit (INDEPENDENT_AMBULATORY_CARE_PROVIDER_SITE_OTHER): Payer: 59 | Admitting: Obstetrics and Gynecology

## 2018-01-13 ENCOUNTER — Encounter: Payer: Self-pay | Admitting: Obstetrics and Gynecology

## 2018-01-13 VITALS — BP 114/80 | HR 84 | Wt 151.1 lb

## 2018-01-13 DIAGNOSIS — Z803 Family history of malignant neoplasm of breast: Secondary | ICD-10-CM

## 2018-01-13 DIAGNOSIS — Z1379 Encounter for other screening for genetic and chromosomal anomalies: Secondary | ICD-10-CM

## 2018-01-13 NOTE — Progress Notes (Signed)
    GYNECOLOGY PROGRESS NOTE  Subjective:    Patient ID: Briana [name], female    DOB: 1966-08-08, 52 y.o.   MRN: [id number]  HPI  Patient is a 52 y.o. [id number] female who presents for discussion of screening for hereditary cancers.  She reports that her mother recently passed away from breast cancer recurrence in November 2018. She notes that her mother was diagnosed at ~ age 50, recently passed at age 50.  She also reports distant cousins with h/o colon cancer and breast cancer. Currently denies any symptoms herself today.   The following portions of the patient's history were reviewed and updated as appropriate: allergies, current medications, past family history, past medical history, past social history, past surgical history and problem list.  Review of Systems Pertinent items noted in HPI and remainder of comprehensive ROS otherwise negative.   Objective:   Blood pressure 114/80, pulse 84, weight 151 lb 1.6 oz (68.5 kg). General appearance: alert and no distress Remainder of exam deferred.   Assessment:   Family h/o breast cancer Genetic testing  Plan:   Discussion had with patient regarding breast cancer risk.   All questions answered.   She currently has 1 close family member (mother) who has passed from breast cancer, originally diagnosed at age 50. Discussed that based on current criteria, although she may be at a higher overall riks than the general population, she may not be covered by insurance to have genetic testing done as her risk is still not high enough.  Offered patient genetic testing as a self-pay patient. Notes she is willing to do this. Ordered [facility] full genetic panel (per request), and referral to genetic counselor if desired. - Will notify patient of results once they return.    A total of 15 minutes were spent face-to-face with the patient during this encounter and over half of that time dealt with counseling and coordination of care.   Rubie Maid, MD [facility]

## 2018-01-15 ENCOUNTER — Encounter: Payer: Self-pay | Admitting: Obstetrics and Gynecology

## 2018-01-20 ENCOUNTER — Telehealth: Payer: Self-pay | Admitting: *Deleted

## 2018-01-20 NOTE — Telephone Encounter (Signed)
Please advice which labs needs to be drawn prior to annal appt.

## 2018-01-20 NOTE — Telephone Encounter (Signed)
Lipid panel, Vitamin D.  All of her other labs were done by her PCP in August and are still good.

## 2018-01-20 NOTE — Telephone Encounter (Signed)
Patient called and states that she would like to get labs drawn before her annual. Patient was scheduled for 01/23/18 but had to r/s due to provider being out of the office that AM. Patient has an appt 02/25/18. Please advise. If it is ok to schedule a lab for her before that appt. Thank you

## 2018-01-21 NOTE — Telephone Encounter (Signed)
Patient called stating she is returning a missed call. Patient is requesting a call back. Thank you

## 2018-01-22 ENCOUNTER — Other Ambulatory Visit: Payer: Self-pay

## 2018-01-22 DIAGNOSIS — Z01419 Encounter for gynecological examination (general) (routine) without abnormal findings: Secondary | ICD-10-CM

## 2018-01-22 NOTE — Telephone Encounter (Signed)
PT was contacted and informed that she needed lipid panel and vit d labs done. Pt has an appt on Feb 12, 2018 at 9am to have those labs done.

## 2018-01-23 ENCOUNTER — Encounter: Payer: 59 | Admitting: Obstetrics and Gynecology

## 2018-02-12 ENCOUNTER — Other Ambulatory Visit: Payer: Self-pay | Admitting: Obstetrics and Gynecology

## 2018-02-12 ENCOUNTER — Other Ambulatory Visit: Payer: 59

## 2018-02-12 DIAGNOSIS — Z113 Encounter for screening for infections with a predominantly sexual mode of transmission: Secondary | ICD-10-CM

## 2018-02-12 DIAGNOSIS — Z01419 Encounter for gynecological examination (general) (routine) without abnormal findings: Secondary | ICD-10-CM

## 2018-02-13 ENCOUNTER — Telehealth: Payer: Self-pay | Admitting: Obstetrics and Gynecology

## 2018-02-13 LAB — VITAMIN D 25 HYDROXY (VIT D DEFICIENCY, FRACTURES): Vit D, 25-Hydroxy: 40.5 ng/mL (ref 30.0–100.0)

## 2018-02-13 LAB — LIPID PANEL
CHOLESTEROL TOTAL: 230 mg/dL — AB (ref 100–199)
Chol/HDL Ratio: 2.9 ratio (ref 0.0–4.4)
HDL: 78 mg/dL (ref >39)
LDL CALC: 140 mg/dL — AB (ref 0–99)
TRIGLYCERIDES: 58 mg/dL (ref 0–149)
VLDL CHOLESTEROL CAL: 12 mg/dL (ref 5–40)

## 2018-02-13 LAB — RPR: RPR: NONREACTIVE

## 2018-02-13 LAB — HIV ANTIBODY (ROUTINE TESTING W REFLEX): HIV SCREEN 4TH GENERATION: NONREACTIVE

## 2018-02-13 NOTE — Telephone Encounter (Signed)
Patient returned your call regarding results °

## 2018-02-13 NOTE — Telephone Encounter (Signed)
Pt called back. °

## 2018-02-15 LAB — GC/CHLAMYDIA PROBE AMP
Chlamydia trachomatis, NAA: NEGATIVE
NEISSERIA GONORRHOEAE BY PCR: NEGATIVE

## 2018-02-25 ENCOUNTER — Ambulatory Visit (INDEPENDENT_AMBULATORY_CARE_PROVIDER_SITE_OTHER): Payer: 59 | Admitting: Obstetrics and Gynecology

## 2018-02-25 ENCOUNTER — Encounter: Payer: Self-pay | Admitting: Obstetrics and Gynecology

## 2018-02-25 VITALS — BP 123/88 | HR 83 | Ht 59.0 in | Wt 153.1 lb

## 2018-02-25 DIAGNOSIS — Z809 Family history of malignant neoplasm, unspecified: Secondary | ICD-10-CM | POA: Diagnosis not present

## 2018-02-25 DIAGNOSIS — E669 Obesity, unspecified: Secondary | ICD-10-CM

## 2018-02-25 DIAGNOSIS — Z1211 Encounter for screening for malignant neoplasm of colon: Secondary | ICD-10-CM

## 2018-02-25 DIAGNOSIS — Z01419 Encounter for gynecological examination (general) (routine) without abnormal findings: Secondary | ICD-10-CM

## 2018-02-25 NOTE — Patient Instructions (Signed)

## 2018-02-25 NOTE — Progress Notes (Addendum)
GYNECOLOGY ANNUAL EXAM CLINIC VISIT Subjective:    Briana [name] is a 52 y.o. [phone] female who presents to establish care and for an annual exam.  The patient has no complaints today. The patient is sexually active (married, 1 partner). The patient wears seatbelts: yes. The patient participates in regular exercise: no. Has the patient ever been transfused or tattooed?: no. The patient reports that there is not domestic violence in her life.   Menstrual History: Menarche: age 83 Patient's last menstrual period was 01/20/2018. Denies h/o STIs.  Last pap smear: 12/2015.Results were normal.  Denies h/o abnormal pap smears.  Last mammogram: 2018 (performed at [facility]). Results were normal.     OB History  Gravida Para Term Preterm AB Living  6 2 2   4 2   SAB TAB Ectopic Multiple Live Births               # Outcome Date GA Lbr Len/2nd Weight Sex Delivery Anes PTL Lv  6 Term 1996     Vag-Spont     5 Term 1987     Vag-Spont     4 AB           3 AB           2 AB           1 AB                Past Medical History:  Diagnosis Date  . Adjustment disorder with anxiety 10/13/2014  . 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 Asthma   . Essential (primary) hypertension 09/22/2014  . Fibroids, intramural 10/13/2014  . Headache, menstrual migraine 10/13/2014     Family History  Problem Relation Age of Onset  . Hypertension Mother   . Breast cancer Mother 70  . Breast cancer Cousin   . Colon cancer Cousin     Past Surgical History:  Procedure Laterality Date  . COSMETIC SURGERY      Social History   Socioeconomic History  . Marital status: Single    Spouse name: Not on file  . Number of children: Not on file  . Years of education: Not on file  . Highest education level: Not on file  Social Needs  . Financial resource strain: Not on file  . Food insecurity - worry: Not on file  . Food insecurity - inability: Not on file  . Transportation needs - medical: Not on  file  . Transportation needs - non-medical: Not on file  Occupational History  . Not on file  Tobacco Use  . Smoking status: Never Smoker  . Smokeless tobacco: Never Used  Substance and Sexual Activity  . Alcohol use: Yes    Comment: occ  . Drug use: No  . Sexual activity: Yes    Comment: partner surgical  Other Topics Concern  . Not on file  Social History Narrative  . Not on file    Outpatient Encounter Medications as of 02/25/2018  Medication Sig Note  . amLODipine (NORVASC) 10 MG tablet TAKE 1 TABLET (10 MG TOTAL) BY MOUTH DAILY. 12/19/2015: Received from: External Pharmacy  . Fluticasone-Salmeterol (ADVAIR DISKUS) 100-50 MCG/DOSE AEPB Inhale 1 puff into the lungs 2 (two) times daily.   Marland Kitchen loratadine (CLARITIN) 10 MG tablet Take 10 mg by mouth. 12/19/2015: Received from: [facility]  . QNASL 80 MCG/ACT AERS Place 2 sprays into both nostrils daily. 12/19/2015: Received from: External Pharmacy  . Fluticasone-Salmeterol (ADVAIR DISKUS) 250-50  MCG/DOSE AEPB     No facility-administered encounter medications on file as of 02/25/2018.     No Known Allergies   Review of Systems Constitutional: negative for chills, fatigue, fevers and sweats Eyes: negative for irritation, redness and visual disturbance Ears, nose, mouth, throat, and face: negative for hearing loss, nasal congestion, snoring and tinnitus Respiratory: negative for asthma, cough, sputum Cardiovascular: negative for chest pain, dyspnea, exertional chest pressure/discomfort, irregular heart beat, palpitations and syncope Gastrointestinal: negative for abdominal pain, change in bowel habits, nausea and vomiting Genitourinary: negative for abnormal menstrual periods, genital lesions, sexual problems and vaginal discharge, dysuria and urinary incontinence Integument/breast: negative for breast lump, breast tenderness and nipple discharge Hematologic/lymphatic: negative for bleeding and easy bruising Musculoskeletal:negative  for back pain and muscle weakness Neurological: negative for dizziness, headaches, vertigo and weakness Endocrine: negative for diabetic symptoms including polydipsia, polyuria and skin dryness Allergic/Immunologic: negative for hay fever and urticaria     Objective:    Blood pressure 123/88, pulse 83, height 4\' 11"  (1.499 m), weight 153 lb 1.6 oz (69.4 kg), last menstrual period 01/20/2018. Body mass index is 30.92 kg/m.  General Appearance:    Alert, cooperative, no distress, appears stated age  Head:    Normocephalic, without obvious abnormality, atraumatic  Eyes:    PERRL, conjunctiva/corneas clear, EOM's intact, both eyes  Ears:    Normal external ear canals, both ears  Nose:   Nares normal, septum midline, mucosa normal, no drainage or sinus tenderness  Throat:   Lips, mucosa, and tongue normal; teeth and gums normal  Neck:   Supple, symmetrical, trachea midline, no adenopathy; thyroid: no enlargement/tenderness/nodules; no carotid bruit or JVD  Back:     Symmetric, no curvature, ROM normal, no CVA tenderness  Lungs:     Clear to auscultation bilaterally, respirations unlabored  Chest Wall:    No tenderness or deformity   Heart:    Regular rate and rhythm, S1 and S2 normal, no murmur, rub or gallop  Breast Exam:    No tenderness, masses, or nipple abnormality  Abdomen:     Soft, non-tender, bowel sounds active all four quadrants, no masses, no organomegaly.  Well healed low transverse abdominal scar (tummy tuck).    Genitalia:    Pelvic:external genitalia normal, vagina without lesions, discharge, or tenderness, rectovaginal septum  normal. Cervix normal in appearance, no cervical motion tenderness, no adnexal masses or tenderness. Uterus slightly enlarged (~ 12 week sized), mobile, nontender.   Rectal:    Normal external sphincter.  No hemorrhoids appreciated. Internal exam not done.   Extremities:   Extremities normal, atraumatic, no cyanosis or edema  Pulses:   2+ and symmetric all  extremities  Skin:   Skin color, texture, turgor normal, no rashes or lesions  Lymph nodes:   Cervical, supraclavicular, and axillary nodes normal  Neurologic:   CNII-XII intact, normal strength, sensation and reflexes throughout    Labs:  No results found for: WBC, HGB, HCT, MCV, PLT    Chemistry   No results found for: NA, K, CL, CO2, BUN, CREATININE, GLU No results found for: CALCIUM, ALKPHOS, AST, ALT, BILITOT     Assessment:   Healthy female exam.   HTN Obesity Family history of cancer  Plan:     Breast self exam technique reviewed and patient encouraged to perform self-exam monthly. Contraception: continue oral progesterone-only contraceptive (for management of menorrhagia) Discussed healthy lifestyle modifications. Pap smear up to date.   Up to date on immunizations.  Has  PCP to manage HTN.  Patient desires to do [facility] for hereditary cancer screening.  Colon cancer screening ordered.  Mammogram up to date.  To continue yearly screening.  Has mammograms performed in [facility].  Follow up in 1 year, or as needed.     Rubie Maid, MD [facility] 02/25/2018 9:37 AM

## 2018-02-25 NOTE — Progress Notes (Signed)
Pt is doing well no concerns.

## 2018-02-26 LAB — COMPREHENSIVE METABOLIC PANEL
A/G RATIO: 1.6 (ref 1.2–2.2)
ALBUMIN: 4.4 g/dL (ref 3.5–5.5)
ALK PHOS: 95 IU/L (ref 39–117)
ALT: 25 IU/L (ref 0–32)
AST: 25 IU/L (ref 0–40)
BUN / CREAT RATIO: 5 — AB (ref 9–23)
BUN: 3 mg/dL — ABNORMAL LOW (ref 6–24)
Bilirubin Total: <0.2 mg/dL (ref 0.0–1.2)
CALCIUM: 9.6 mg/dL (ref 8.7–10.2)
CO2: 23 mmol/L (ref 20–29)
CREATININE: 0.6 mg/dL (ref 0.57–1.00)
Chloride: 104 mmol/L (ref 96–106)
GFR calc Af Amer: 124 mL/min/{1.73_m2} (ref >59)
GFR, EST NON AFRICAN AMERICAN: 107 mL/min/{1.73_m2} (ref >59)
Globulin, Total: 2.8 g/dL (ref 1.5–4.5)
Glucose: 89 mg/dL (ref 65–99)
POTASSIUM: 3.6 mmol/L (ref 3.5–5.2)
SODIUM: 142 mmol/L (ref 134–144)
Total Protein: 7.2 g/dL (ref 6.0–8.5)

## 2018-02-26 LAB — CBC
HEMOGLOBIN: 14 g/dL (ref 11.1–15.9)
Hematocrit: 41.4 % (ref 34.0–46.6)
MCH: 29.7 pg (ref 26.6–33.0)
MCHC: 33.8 g/dL (ref 31.5–35.7)
MCV: 88 fL (ref 79–97)
Platelets: 285 10*3/uL (ref 150–379)
RBC: 4.72 x10E6/uL (ref 3.77–5.28)
RDW: 14.9 % (ref 12.3–15.4)
WBC: 4.5 10*3/uL (ref 3.4–10.8)

## 2018-02-26 LAB — TSH: TSH: 0.92 u[IU]/mL (ref 0.450–4.500)

## 2018-03-06 ENCOUNTER — Telehealth: Payer: Self-pay

## 2018-03-06 NOTE — Telephone Encounter (Signed)
Pt called and informed that [facility] wanted her to call them to make a payment to complete results. Pt was given number to [facility] and advised if she had any questions to please call the office.

## 2018-03-13 ENCOUNTER — Encounter: Payer: Self-pay | Admitting: Emergency Medicine

## 2018-03-13 ENCOUNTER — Other Ambulatory Visit: Payer: Self-pay

## 2018-03-13 ENCOUNTER — Emergency Department
Admission: EM | Admit: 2018-03-13 | Discharge: 2018-03-13 | Disposition: A | Discharge status: Home / Self Care | Payer: 59 | Attending: Emergency Medicine | Admitting: Emergency Medicine

## 2018-03-13 DIAGNOSIS — I10 Essential (primary) hypertension: Secondary | ICD-10-CM | POA: Diagnosis not present

## 2018-03-13 DIAGNOSIS — Z79899 Other long term (current) drug therapy: Secondary | ICD-10-CM | POA: Diagnosis not present

## 2018-03-13 DIAGNOSIS — J45909 Unspecified asthma, uncomplicated: Secondary | ICD-10-CM | POA: Diagnosis not present

## 2018-03-13 NOTE — ED Triage Notes (Signed)
Patient ambulatory to triage with steady gait, without difficulty or distress noted; pt reports BP 130-140s/100s; st hx HTN; pt denies accomp c/o

## 2018-03-13 NOTE — Discharge Instructions (Addendum)
Please make an appointment to follow-up with your primary care physician this coming Monday for recheck and return to the emergency department for any concerns.  It was a pleasure to take care of you today, and thank you for coming to our emergency department.  If you have any questions or concerns before leaving please ask the nurse to grab me and I'm more than happy to go through your aftercare instructions again.  If you were prescribed any opioid pain medication today such as Norco, Vicodin, Percocet, morphine, hydrocodone, or oxycodone please make sure you do not drive when you are taking this medication as it can alter your ability to drive safely.  If you have any concerns once you are home that you are not improving or are in fact getting worse before you can make it to your follow-up appointment, please do not hesitate to call 911 and come back for further evaluation.  Darel Hong, MD

## 2018-03-13 NOTE — ED Provider Notes (Signed)
[facility] Emergency Department Provider Note  ____________________________________________   First MD Initiated Contact with Patient 03/13/18 0335     (approximate)  I have reviewed the triage vital signs and the nursing notes.   HISTORY  Chief Complaint Hypertension   HPI Briana [name] is a 52 y.o. female who self presents to the emergency department for evaluation of her blood pressure.  She has a past medical history of hypertension for which she takes amlodipine.  This morning at 2 in the morning she awoke and was concerned about her blood pressure so she checked it it noted it was 130/80.  She checked it again and it was 140/90 so she decided to come to the hospital.  She is worried because it is normally 110/80.  She denies headache double vision blurred vision chest pain shortness of breath abdominal pain nausea vomiting fevers or chills.  Past Medical History:  Diagnosis Date  . Adjustment disorder with anxiety 10/13/2014  . 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 Asthma   . Essential (primary) hypertension 09/22/2014  . Fibroids, intramural 10/13/2014  . Headache, menstrual migraine 10/13/2014    Patient Active Problem List   Diagnosis Date Noted  . Perimenopausal vasomotor symptoms 07/27/2017  . Asthma 07/04/2016  . Adjustment disorder with anxiety 10/13/2014  . 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 Excessive and frequent menstruation 10/13/2014  . Headache, menstrual migraine 10/13/2014  . Fibroids, intramural 10/13/2014  . Absolute anemia 09/22/2014  . Essential (primary) hypertension 09/22/2014    Past Surgical History:  Procedure Laterality Date  . COSMETIC SURGERY      Prior to Admission medications   Medication Sig Start Date End Date Taking? Authorizing Provider  amLODipine (NORVASC) 10 MG tablet TAKE 1 TABLET (10 MG TOTAL) BY MOUTH DAILY. 12/06/15   [provider]  Fluticasone-Salmeterol (ADVAIR DISKUS) 100-50  MCG/DOSE AEPB Inhale 1 puff into the lungs 2 (two) times daily. 11/20/17 11/20/18  Flora Lipps, MD  Fluticasone-Salmeterol (ADVAIR DISKUS) 250-50 MCG/DOSE AEPB  03/30/17   [provider]  loratadine (CLARITIN) 10 MG tablet Take 10 mg by mouth. 09/11/15   [provider]  QNASL 80 MCG/ACT AERS Place 2 sprays into both nostrils daily. 11/18/15   [provider]    Allergies Patient has no known allergies.  Family History  Problem Relation Age of Onset  . Hypertension Mother   . Breast cancer Mother 8  . Breast cancer Cousin   . Colon cancer Cousin     Social History Social History   Tobacco Use  . Smoking status: Never Smoker  . Smokeless tobacco: Never Used  Substance Use Topics  . Alcohol use: Yes    Comment: occ  . Drug use: No    Review of Systems Constitutional: No fever/chills ENT: No sore throat. Cardiovascular: Denies chest pain. Respiratory: Denies shortness of breath. Gastrointestinal: No abdominal pain.  No nausea, no vomiting.  No diarrhea.  No constipation. Musculoskeletal: Negative for back pain. Neurological: Negative for headaches   ____________________________________________   PHYSICAL EXAM:  VITAL SIGNS: ED Triage Vitals  Enc Vitals Group     BP 03/13/18 0332 (!) 155/102     Pulse Rate 03/13/18 0332 72     Resp 03/13/18 0332 20     Temp 03/13/18 0332 97.8 F (36.6 C)     Temp Source 03/13/18 0332 Oral     SpO2 03/13/18 0332 99 %     Weight 03/13/18 0331 153 lb (69.4 kg)  Height 03/13/18 0331 4\' 11"  (1.499 m)     Head Circumference --      Peak Flow --      Pain Score 03/13/18 0331 0     Pain Loc --      Pain Edu? --      Excl. in [place]? --     Constitutional: Alert and oriented x4 anxious appearing nontoxic no diaphoresis speaks full clear sentences Head: Atraumatic.  Normal tympanic membranes bilaterally Nose: No congestion/rhinnorhea. Mouth/Throat: No trismus uvula midline no pharyngeal erythema or  exudate Neck: No stridor.  No lymphadenopathy Cardiovascular: Regular rate and rhythm Respiratory: Normal respiratory effort.  No retractions.  Clear to auscultation bilaterally Neurologic:  Normal speech and language. No gross focal neurologic deficits are appreciated.  Skin:  Skin is warm, dry and intact. No rash noted.    ____________________________________________  LABS (all labs ordered are listed, but only abnormal results are displayed)  Labs Reviewed - No data to display   __________________________________________  EKG   ____________________________________________  RADIOLOGY   ____________________________________________   DIFFERENTIAL includes but not limited to  Chronic hypertension, panic attack, anxiety, hypertensive emergency   PROCEDURES  Procedure(s) performed: no  Procedures  Critical Care performed: no  Observation: no ____________________________________________   INITIAL IMPRESSION / ASSESSMENT AND PLAN / ED COURSE  Pertinent labs & imaging results that were available during my care of the patient were reviewed by me and considered in my medical decision making (see chart for details).  The patient arrives hemodynamically stable with no complaints and a normal exam.  I had a lengthy discussion with the patient regarding her well-controlled hypertension and the importance of primary care follow-up.  Strict return precautions have been given to the patient verbalizes understanding and agreement with the plan.      ____________________________________________   FINAL CLINICAL IMPRESSION(S) / ED DIAGNOSES  Final diagnoses:  Hypertension, unspecified type      NEW MEDICATIONS STARTED DURING THIS VISIT:  Discharge Medication List as of 03/13/2018  3:36 AM       Note:  This document was prepared using Dragon voice recognition software and may include unintentional dictation errors.      Darel Hong, MD 03/13/18  [phone]

## 2018-03-13 NOTE — ED Notes (Addendum)
Pt states she checked her BP PTA and states it was higher than normal, pt states she checked it multiple times with it starting at the 130's and got to 150's and states she thought that was too high and needed to come to ED. Pt states she does have hx of HTN, pt states she takes amlodipine. Pt denies HA or vision changes or any other concerns. Pt denies CP or SHOB. Pt A&O and in NAD at this time.

## 2018-03-18 ENCOUNTER — Encounter: Payer: Self-pay | Admitting: *Deleted

## 2018-05-13 ENCOUNTER — Ambulatory Visit (INDEPENDENT_AMBULATORY_CARE_PROVIDER_SITE_OTHER): Payer: 59 | Admitting: Internal Medicine

## 2018-05-13 ENCOUNTER — Encounter: Payer: Self-pay | Admitting: Internal Medicine

## 2018-05-13 VITALS — BP 98/68 | HR 75 | Resp 16 | Ht 59.0 in | Wt 148.0 lb

## 2018-05-13 DIAGNOSIS — G4733 Obstructive sleep apnea (adult) (pediatric): Secondary | ICD-10-CM | POA: Diagnosis not present

## 2018-05-13 NOTE — Progress Notes (Signed)
[facility] Consultation      Date: 05/13/2018,   MRN# [id number] LONEY PETO [birth year] Code Status:  Code Status History    This patient does not have a recorded code status. Please follow your organizational policy for patients in this situation.       AdmissionWeight: 148 lb (67.1 kg)                 CurrentWeight: 148 lb (67.1 kg) GWYNNETH [name] is a 52 y.o. old female seen in consultation for SOB at the request of patient.    PFT'S 07/2016 RATIO 82% FEV1 81% DLCO 85% Fef25/75 midl small airways obstructive disease  6MWT 1410 FEET 97%    CHIEF COMPLAINT:   Follow up ASTHMA/OSA.    HISTORY OF PRESENT ILLNESS  Patient has a history of asthma, and obstructive sleep apnea, she normally sees Dr. Mortimer Fries, she is here for follow-up. She notes that she is more awake during the day, she is no longer snoring. She is still getting used to it, she does her 4-5 hours and then takes it off because she does not like the mask.  She really been using CPAP as a short-term problem until she loses enough weight to get off of it.  No signs of infection at this time  Sleep study shows mild OSA index of 2.8, 117 snoring episodes Drop in 02 sats  Download data personally reviewed, 30 days as of 05/11/2018.  Uses greater than 4 hours 29/30 days.  Average usage on days used is 5 hours.  Auto set 5-20.  Median pressure 7, 93rd percentile pressure 10, maximum pressure 11.  Residual AHI is 0.7.  Overall this shows excellent compliance with excellent control of obstructive sleep apnea.    Current Medication:  Current Outpatient Medications:  .  albuterol (PROVENTIL HFA) 108 (90 Base) MCG/ACT inhaler, Inhale 2 puffs into the lungs every 6 (six) hours as needed., Disp: , Rfl:  .  amLODipine (NORVASC) 10 MG tablet, TAKE 1 TABLET (10 MG TOTAL) BY MOUTH DAILY., Disp: , Rfl: 0 .  Fluticasone-Salmeterol (ADVAIR DISKUS) 100-50 MCG/DOSE AEPB, Inhale 1 puff into the lungs 2 (two)  times daily., Disp: 1 each, Rfl: 6 .  KLOR-CON M20 20 MEQ tablet, Take 20 mEq by mouth daily., Disp: , Rfl: 5 .  loratadine (CLARITIN) 10 MG tablet, Take 10 mg by mouth., Disp: , Rfl:  .  losartan (COZAAR) 25 MG tablet, Take 25 mg by mouth daily., Disp: , Rfl:  .  QNASL 80 MCG/ACT AERS, Place 2 sprays into both nostrils daily., Disp: , Rfl: 12    ALLERGIES   Patient has no known allergies.     REVIEW OF SYSTEMS   Review of Systems  Constitutional: Negative for chills, diaphoresis, fever, malaise/fatigue and weight loss.  HENT: Negative for congestion.   Eyes: Negative for blurred vision and double vision.  Respiratory: Negative for cough, hemoptysis, sputum production, shortness of breath and wheezing.   Cardiovascular: Negative for chest pain, palpitations, orthopnea and leg swelling.  Gastrointestinal: Negative for heartburn and nausea.  Musculoskeletal: Negative for neck pain.  Neurological: Negative for weakness.  All other systems reviewed and are negative.    BP 98/68 (BP Location: Left Arm, Cuff Size: Normal)   Pulse 75   Resp 16   Ht 4\' 11"  (1.499 m)   Wt 148 lb (67.1 kg)   SpO2 100%   BMI 29.89 kg/m    PHYSICAL EXAM  Physical Exam  Constitutional: She is oriented to person, place, and time. She appears well-developed and well-nourished. No distress.  Neck: Neck supple.  Cardiovascular: Normal rate, regular rhythm and normal heart sounds.  No murmur heard. Pulmonary/Chest: Effort normal and breath sounds normal. No stridor. No respiratory distress. She has no wheezes. She has no rales.  Musculoskeletal: Normal range of motion. She exhibits no edema.  Neurological: She is alert and oriented to person, place, and time. No cranial nerve deficit.  Skin: Skin is warm. She is not diaphoretic.  Psychiatric: She has a normal mood and affect.      ASSESSMENT/PLAN   52 yo AAF with mild intermittent uncomplicated ASTHMA with alergic rhinitis with MILD  OSA Continue current therapies including CPAP.  1.albuterol as needed 2.advair 100/50 BID  3.continue nasal spray and antihistamine 4.continue CPAP.  Patient expressed that she hopes to eventually get off CPAP.  Explained that some patients can get off of CPAP with a large amount of weight loss, but for many patients they are on it for the rest of her lives.  In her case she really does not see herself using CPAP long-term.  Discussed that if she wants to discontinue CPAP dental device could be a good option for her, and we could refer her to a dentist for a dental device in the future if needed.  Discussed possible referral to mask fitting clinic, but she feels at this time that current mask seems to fit adequately.  6.recommend weight loss, recommend avoiding supine position when sleeping  Follow up in 3 months  [name], MD.   Board Certified in Internal Medicine, Pulmonary Medicine, [place], and Sleep Medicine.  [facility] Office Number: [phone] Pager: [phone]  [name], M.D.  Merton Border, M.D

## 2018-05-13 NOTE — Patient Instructions (Addendum)
Continue using CPAP every night, for the whole night.

## 2018-05-22 ENCOUNTER — Encounter: Payer: Self-pay | Admitting: Cardiovascular Disease

## 2018-05-22 ENCOUNTER — Ambulatory Visit (INDEPENDENT_AMBULATORY_CARE_PROVIDER_SITE_OTHER): Payer: 59 | Admitting: Cardiovascular Disease

## 2018-05-22 VITALS — BP 114/82 | HR 76 | Ht 59.5 in | Wt 144.8 lb

## 2018-05-22 DIAGNOSIS — I10 Essential (primary) hypertension: Secondary | ICD-10-CM | POA: Diagnosis not present

## 2018-05-22 DIAGNOSIS — R002 Palpitations: Secondary | ICD-10-CM

## 2018-05-22 NOTE — Progress Notes (Signed)
Cardiology Office Note   Date:  05/22/2018   ID:  Briana [name], DOB [birth year], MRN [id number]  PCP:  Robyne Peers, MD  Cardiologist:   Kathlyn Sacramento, MD   Chief Complaint  Patient presents with  . OTHER    BP issues and rapid heart beat occassionally. Meds reviewed verbally with pt.      History of Present Illness: Briana [name] is a 52 y.o. female who presents for evaluation of hypertension.  She has no prior cardiac history.  She reports known history of hypertension for about 10 years that has been treated with amlodipine.  Over the last 6 to 9 months, she had sudden increase in blood pressure readings with associated hot flashes, palpitations and overall fatigue.  Her thyroid function was hyper but that has improved and since then her palpitations has resolved.  She reports recent history of hypokalemia of unclear etiology.  She also has mild sleep apnea.  She is not a smoker.  Family history is remarkable for hypertension.  Her father died of myocardial infarction but he was a smoker and sedentary.  She denies any chest pain or shortness of breath.  She no longer has palpitations. Blood pressure was elevated on amlodipine and thus losartan was added.  Since then, blood pressure improved.  She also has been taking potassium chloride 20 mEq once daily.    Past Medical History:  Diagnosis Date  . Adjustment disorder with anxiety 10/13/2014  . 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 Asthma   . Essential (primary) hypertension 09/22/2014  . Fibroids, intramural 10/13/2014  . Headache, menstrual migraine 10/13/2014    Past Surgical History:  Procedure Laterality Date  . COSMETIC SURGERY       Current Outpatient Medications  Medication Sig Dispense Refill  . albuterol (PROVENTIL HFA) 108 (90 Base) MCG/ACT inhaler Inhale 2 puffs into the lungs every 6 (six) hours as needed.    Marland Kitchen amLODipine (NORVASC) 10 MG tablet TAKE 1 TABLET (10 MG TOTAL) BY MOUTH DAILY.  0  .  BLACK COHOSH PO Take by mouth daily.    . Fluticasone-Salmeterol (ADVAIR DISKUS) 100-50 MCG/DOSE AEPB Inhale 1 puff into the lungs 2 (two) times daily. 1 each 6  . KLOR-CON M20 20 MEQ tablet Take 20 mEq by mouth daily.  5  . loratadine (CLARITIN) 10 MG tablet Take 10 mg by mouth.    . losartan (COZAAR) 25 MG tablet Take 25 mg by mouth daily.    . Prenatal Vit-Fe Fumarate-FA (PRENATAL MULTIVITAMIN) TABS tablet Take 1 tablet by mouth daily at 12 noon.    Norvel Richards 80 MCG/ACT AERS Place 2 sprays into both nostrils daily.  12   No current facility-administered medications for this visit.     Allergies:   Patient has no known allergies.    Social History:  The patient  reports that she has never smoked. She has never used smokeless tobacco. She reports that she drinks alcohol. She reports that she does not use drugs.   Family History:  The patient's family history includes Breast cancer in her cousin; Breast cancer (age of onset: 51) in her mother; Colon cancer in her cousin; Heart attack in her father; Hypertension in her mother.    ROS:  Please see the history of present illness.   Otherwise, review of systems are positive for none.   All other systems are reviewed and negative.    PHYSICAL EXAM: VS:  BP 114/82 (BP Location: Left Arm,  Patient Position: Sitting, Cuff Size: Normal)   Pulse 76   Ht 4' 11.5" (1.511 m)   Wt 144 lb 12 oz (65.7 kg)   BMI 28.75 kg/m  , BMI Body mass index is 28.75 kg/m. GEN: Well nourished, well developed, in no acute distress  HEENT: normal  Neck: no JVD, carotid bruits, or masses Cardiac: RRR; no murmurs, rubs, or gallops,no edema  Respiratory:  clear to auscultation bilaterally, normal work of breathing GI: soft, nontender, nondistended, + BS MS: no deformity or atrophy  Skin: warm and dry, no rash Neuro:  Strength and sensation are intact Psych: euthymic mood, full affect   EKG:  EKG is ordered today. The ekg ordered today demonstrates normal sinus  rhythm with sinus arrhythmia.  Nonspecific T wave changes.   Recent Labs: 02/25/2018: ALT 25; BUN 3; Creatinine, Ser 0.60; Hemoglobin 14.0; Platelets 285; Potassium 3.6; Sodium 142; TSH 0.920    Lipid Panel    Component Value Date/Time   CHOL 230 (H) 02/12/2018 0933   TRIG 58 02/12/2018 0933   HDL 78 02/12/2018 0933   CHOLHDL 2.9 02/12/2018 0933   LDLCALC 140 (H) 02/12/2018 0933      Wt Readings from Last 3 Encounters:  05/22/18 144 lb 12 oz (65.7 kg)  05/13/18 148 lb (67.1 kg)  03/13/18 153 lb (69.4 kg)       PAD Screen 05/22/2018  Previous PAD dx? No  Previous surgical procedure? Yes  Dates of procedures 2007  Pain with walking? No  Feet/toe relief with dangling? No  Painful, non-healing ulcers? No  Extremities discolored? No      ASSESSMENT AND PLAN:  1.  Hypertension: Likely essential hypertension.  However, given the recent worsening of blood pressure control, female gender as well as hypokalemia, I think we have to exclude secondary hypertension as a culprit for recent worsening of blood pressure control. She does have sleep apnea but that seems to be mild over old and unlikely to be contributing to elevated blood pressure. I recommend evaluation for possible hyperaldosteronism or renal artery stenosis due to fibromuscular dysplasia.  I requested aldosterone to renin ratio as well as renal artery duplex. If testing is unremarkable, she can continue to take the same medications.  2.  Palpitations: Symptoms resolved completely.  Thus, no further work-up is needed.    Disposition:   FU with me as needed.   Signed,  Kathlyn Sacramento, MD  05/22/2018 4:35 PM    [name]

## 2018-05-22 NOTE — Patient Instructions (Signed)
Medication Instructions: Your physician recommends that you continue on your current medications as directed. Please refer to the Current Medication list given to you today.  If you need a refill on your cardiac medications before your next appointment, please call your pharmacy.   Labwork: Your provider would like for you to return on the same day you have the Renal Duplex to have the following lab drawn: Aldosterone ratio  Procedures/Testing: Your physician has requested that you have a renal artery duplex. During this test, an ultrasound is used to evaluate blood flow to the kidneys. Allow one hour for this exam. Do not eat after midnight the day before and avoid carbonated beverages. Take your medications as you usually do.   Follow-Up: Your physician wants you to follow-up as needed with Dr. Fletcher Anon.   Thank you for choosing [facility] at [facility]!

## 2018-05-27 ENCOUNTER — Other Ambulatory Visit (INDEPENDENT_AMBULATORY_CARE_PROVIDER_SITE_OTHER): Payer: 59

## 2018-05-27 ENCOUNTER — Ambulatory Visit (INDEPENDENT_AMBULATORY_CARE_PROVIDER_SITE_OTHER): Payer: 59

## 2018-05-27 DIAGNOSIS — I10 Essential (primary) hypertension: Secondary | ICD-10-CM

## 2018-05-30 LAB — ALDOSTERONE + RENIN ACTIVITY W/ RATIO
ALDOS/RENIN RATIO: 4 (ref 0.0–30.0)
ALDOSTERONE: 7.7 ng/dL (ref 0.0–30.0)
RENIN: 1.928 ng/mL/h (ref 0.167–5.380)

## 2018-08-05 ENCOUNTER — Other Ambulatory Visit: Payer: Self-pay

## 2018-08-05 DIAGNOSIS — Z1211 Encounter for screening for malignant neoplasm of colon: Secondary | ICD-10-CM

## 2018-08-06 ENCOUNTER — Telehealth: Payer: Self-pay

## 2018-08-06 NOTE — Telephone Encounter (Signed)
Patient contacted office to reschedule colooscopy from 09/09 to 09/30.  Briana [name] in Endo has been informed.  Thanks [employer]

## 2018-09-14 ENCOUNTER — Encounter
Admission: RE | Disposition: A | Discharge status: Home / Self Care | Payer: Self-pay | Source: Ambulatory Visit | Attending: Gastroenterology

## 2018-09-14 ENCOUNTER — Encounter: Payer: Self-pay | Admitting: Anesthesiology

## 2018-09-14 ENCOUNTER — Ambulatory Visit: Payer: 59 | Admitting: Anesthesiology

## 2018-09-14 ENCOUNTER — Ambulatory Visit
Admission: RE | Admit: 2018-09-14 | Discharge: 2018-09-14 | Disposition: A | Discharge status: Home / Self Care | Payer: 59 | Source: Ambulatory Visit | Attending: Gastroenterology | Admitting: Gastroenterology

## 2018-09-14 DIAGNOSIS — Z1211 Encounter for screening for malignant neoplasm of colon: Secondary | ICD-10-CM | POA: Insufficient documentation

## 2018-09-14 DIAGNOSIS — Z0000 Encounter for general adult medical examination without abnormal findings: Secondary | ICD-10-CM

## 2018-09-14 DIAGNOSIS — Z79899 Other long term (current) drug therapy: Secondary | ICD-10-CM | POA: Insufficient documentation

## 2018-09-14 DIAGNOSIS — F4322 Adjustment disorder with anxiety: Secondary | ICD-10-CM | POA: Insufficient documentation

## 2018-09-14 DIAGNOSIS — J45909 Unspecified asthma, uncomplicated: Secondary | ICD-10-CM | POA: Insufficient documentation

## 2018-09-14 HISTORY — PX: COLONOSCOPY WITH PROPOFOL: SHX5780

## 2018-09-14 LAB — POCT PREGNANCY, URINE: PREG TEST UR: NEGATIVE

## 2018-09-14 SURGERY — COLONOSCOPY WITH PROPOFOL
Anesthesia: General

## 2018-09-14 MED ORDER — PROPOFOL 500 MG/50ML IV EMUL
INTRAVENOUS | Status: DC | PRN
  Administered 2018-09-14: 150 ug/kg/min via INTRAVENOUS

## 2018-09-14 MED ORDER — SODIUM CHLORIDE 0.9 % IV SOLN
INTRAVENOUS | Status: DC
  Administered 2018-09-14: 1000 mL via INTRAVENOUS

## 2018-09-14 MED ORDER — PROPOFOL 10 MG/ML IV BOLUS
INTRAVENOUS | Status: DC | PRN
  Administered 2018-09-14 (×3): 50 mg via INTRAVENOUS

## 2018-09-14 NOTE — H&P (Signed)
Vonda Antigua, MD [address], [place], [place], [place], [address] [address], [place], [place], [place], [address] Phone: [phone]  Fax: [phone]  Primary Care Physician:  Robyne Peers, MD   Pre-Procedure History & Physical: HPI:  Briana [name] is a 52 y.o. female is here for a colonoscopy.   Past Medical History:  Diagnosis Date  . Adjustment disorder with anxiety 10/13/2014  . 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 Asthma   . Essential (primary) hypertension 09/22/2014  . Fibroids, intramural 10/13/2014  . Headache, menstrual migraine 10/13/2014    Past Surgical History:  Procedure Laterality Date  . COSMETIC SURGERY      Prior to Admission medications   Medication Sig Start Date End Date Taking? Authorizing Provider  albuterol (PROVENTIL HFA) 108 (90 Base) MCG/ACT inhaler Inhale 2 puffs into the lungs every 6 (six) hours as needed. 01/05/16  Yes [provider]  amLODipine (NORVASC) 10 MG tablet TAKE 1 TABLET (10 MG TOTAL) BY MOUTH DAILY. 12/06/15  Yes [provider]  BLACK COHOSH PO Take by mouth daily.   Yes [provider]  Fluticasone-Salmeterol (ADVAIR DISKUS) 100-50 MCG/DOSE AEPB Inhale 1 puff into the lungs 2 (two) times daily. 11/20/17 11/20/18 Yes Kasa, Maretta Bees, MD  KLOR-CON M20 20 MEQ tablet Take 20 mEq by mouth daily. 04/13/18  Yes [provider]  loratadine (CLARITIN) 10 MG tablet Take 10 mg by mouth. 09/11/15  Yes [provider]  losartan (COZAAR) 25 MG tablet Take 25 mg by mouth daily. 03/25/18  Yes [provider]  Prenatal Vit-Fe Fumarate-FA (PRENATAL MULTIVITAMIN) TABS tablet Take 1 tablet by mouth daily at 12 noon.   Yes [provider]  QNASL 80 MCG/ACT AERS Place 2 sprays into both nostrils daily. 11/18/15  Yes [provider]    Allergies as of 08/06/2018  . (No Known Allergies)    Family History  Problem Relation Age of Onset  . Hypertension Mother   .  Breast cancer Mother 60  . Heart attack Father   . Breast cancer Cousin   . Colon cancer Cousin     Social History   Socioeconomic History  . Marital status: Single    Spouse name: Not on file  . Number of children: Not on file  . Years of education: Not on file  . Highest education level: Not on file  Occupational History  . Not on file  Social Needs  . Financial resource strain: Not on file  . Food insecurity:    Worry: Not on file    Inability: Not on file  . Transportation needs:    Medical: Not on file    Non-medical: Not on file  Tobacco Use  . Smoking status: Never Smoker  . Smokeless tobacco: Never Used  Substance and Sexual Activity  . Alcohol use: Yes    Comment: occ  . Drug use: No  . Sexual activity: Yes    Comment: partner surgical  Lifestyle  . Physical activity:    Days per week: Not on file    Minutes per session: Not on file  . Stress: Not on file  Relationships  . Social connections:    Talks on phone: Not on file    Gets together: Not on file    Attends religious service: Not on file    Active member of club or organization: Not on file    Attends meetings of clubs or organizations: Not on file    Relationship status:  Not on file  . Intimate partner violence:    Fear of current or ex partner: Not on file    Emotionally abused: Not on file    Physically abused: Not on file    Forced sexual activity: Not on file  Other Topics Concern  . Not on file  Social History Narrative  . Not on file    Review of Systems: See HPI, otherwise negative ROS  Physical Exam: BP (!) 113/101   Pulse 80   Temp (!) 97.3 F (36.3 C) (Tympanic)   Resp 20   Ht 4' 11.5" (1.511 m)   Wt 62.6 kg   SpO2 100%   BMI 27.41 kg/m  General:   Alert,  pleasant and cooperative in NAD Head:  Normocephalic and atraumatic. Neck:  Supple; no masses or thyromegaly. Lungs:  Clear throughout to auscultation, normal respiratory effort.    Heart:  +S1, +S2, Regular rate  and rhythm, No edema. Abdomen:  Soft, nontender and nondistended. Normal bowel sounds, without guarding, and without rebound.   Neurologic:  Alert and  oriented x4;  grossly normal neurologically.  Impression/Plan: Briana [name] is here for a colonoscopy to be performed for average risk screening.  Risks, benefits, limitations, and alternatives regarding  colonoscopy have been reviewed with the patient.  Questions have been answered.  All parties agreeable.   Virgel Manifold, MD  09/14/2018, 2:03 PM

## 2018-09-14 NOTE — Anesthesia Postprocedure Evaluation (Signed)
Anesthesia Post Note  Patient: Briana [name]  Procedure(s) Performed: COLONOSCOPY WITH PROPOFOL (N/A )  Patient location during evaluation: Endoscopy Anesthesia Type: General Level of consciousness: awake and alert Pain management: pain level controlled Vital Signs Assessment: post-procedure vital signs reviewed and stable Respiratory status: spontaneous breathing, nonlabored ventilation, respiratory function stable and patient connected to nasal cannula oxygen Cardiovascular status: blood pressure returned to baseline and stable Postop Assessment: no apparent nausea or vomiting Anesthetic complications: no     Last Vitals:  Vitals:   09/14/18 1506 09/14/18 1516  BP: 121/88 108/84  Pulse: 84 80  Resp: 16 15  Temp:    SpO2: 100% 98%    Last Pain:  Vitals:   09/14/18 1516  TempSrc:   PainSc: 0-No pain                 Martha Clan

## 2018-09-14 NOTE — Anesthesia Preprocedure Evaluation (Signed)
Anesthesia Evaluation  Patient identified by MRN, date of birth, ID band Patient awake    Reviewed: Allergy & Precautions, H&P , NPO status , Patient's Chart, lab work & pertinent test results, reviewed documented beta blocker date and time   History of Anesthesia Complications Negative for: history of anesthetic complications  Airway Mallampati: I  TM Distance: >3 FB Neck ROM: full    Dental  (+) Caps, Dental Advidsory Given, Teeth Intact   Pulmonary neg shortness of breath, asthma , neg sleep apnea, neg COPD, neg recent URI,           Cardiovascular Exercise Tolerance: Good hypertension, (-) angina(-) CAD, (-) Past MI, (-) Cardiac Stents and (-) CABG negative cardio ROS  (-) dysrhythmias (-) Valvular Problems/Murmurs     Neuro/Psych PSYCHIATRIC DISORDERS negative neurological ROS     GI/Hepatic negative GI ROS, Neg liver ROS,   Endo/Other  negative endocrine ROS  Renal/GU negative Renal ROS  negative genitourinary   Musculoskeletal   Abdominal   Peds  Hematology negative hematology ROS (+)   Anesthesia Other Findings Past Medical History: 10/13/2014: Adjustment disorder with anxiety 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 No date: Asthma 09/22/2014: Essential (primary) hypertension 10/13/2014: Fibroids, intramural 10/13/2014: Headache, menstrual migraine   Reproductive/Obstetrics negative OB ROS                             Anesthesia Physical Anesthesia Plan  ASA: II  Anesthesia Plan: General   Post-op Pain Management:    Induction: Intravenous  PONV Risk Score and Plan: 3 and Propofol infusion and TIVA  Airway Management Planned: Nasal Cannula and Natural Airway  Additional Equipment:   Intra-op Plan:   Post-operative Plan:   Informed Consent: I have reviewed the patients History and Physical, chart, labs and discussed the procedure including the risks, benefits and  alternatives for the proposed anesthesia with the patient or authorized representative who has indicated his/her understanding and acceptance.   Dental Advisory Given  Plan Discussed with: Anesthesiologist, CRNA and Surgeon  Anesthesia Plan Comments:         Anesthesia Quick Evaluation

## 2018-09-14 NOTE — Op Note (Signed)
[facility] Patient Name: Briana [name] Procedure Date: 09/14/2018 2:03 PM MRN: [id number] Account #: [id number] Date of Birth: Mar 03, 1966 Admit Type: Outpatient Age: 52 Room: [facility] ENDO ROOM 4 Gender: Female Note Status: Finalized Procedure:            Colonoscopy Indications:          Screening for colorectal malignant neoplasm Providers:            Graesyn Schreifels B. Bonna Gains MD, MD Referring MD:         Wynonia Musty. Ruben Gottron (Referring MD) Medicines:            Monitored Anesthesia Care Complications:        No immediate complications. Procedure:            Pre-Anesthesia Assessment:                       - Prior to the procedure, a History and Physical was                        performed, and patient medications, allergies and                        sensitivities were reviewed. The patient's tolerance of                        previous anesthesia was reviewed.                       - The risks and benefits of the procedure and the                        sedation options and risks were discussed with the                        patient. All questions were answered and informed                        consent was obtained.                       - Patient identification and proposed procedure were                        verified prior to the procedure by the physician, the                        nurse, the anesthetist and the technician. The                        procedure was verified in the pre-procedure area in the                        procedure room in the endoscopy suite.                       - ASA Grade Assessment: II - A patient with mild                        systemic disease.                       -  After reviewing the risks and benefits, the patient                        was deemed in satisfactory condition to undergo the                        procedure.                       After obtaining informed consent, the colonoscope was                passed under direct vision. Throughout the procedure,                        the patient's blood pressure, pulse, and oxygen                        saturations were monitored continuously. The                        Colonoscope was introduced through the anus and                        advanced to the the cecum, identified by appendiceal                        orifice and ileocecal valve. The colonoscopy was                        performed with ease. The patient tolerated the                        procedure well. The quality of the bowel preparation                        was good. Findings:      The perianal and digital rectal examinations were normal.      The rectum, sigmoid colon, descending colon, transverse colon, ascending       colon and cecum appeared normal.      The retroflexed view of the distal rectum and anal verge was normal and       showed no anal or rectal abnormalities. Impression:           - The rectum, sigmoid colon, descending colon,                        transverse colon, ascending colon and cecum are normal.                       - The distal rectum and anal verge are normal on                        retroflexion view.                       - No specimens collected. Recommendation:       - Discharge patient to home.                       - Resume previous diet.                       -  Continue present medications.                       - Repeat colonoscopy in 10 years for screening purposes.                       - Return to primary care physician as previously                        scheduled.                       - The findings and recommendations were discussed with                        the patient.                       - The findings and recommendations were discussed with                        the patient's family.                       - In the future, if patient develops new symptoms such                        as blood per rectum,  abdominal pain, weight loss,                        altered bowel habits or any other reason for concern,                        patient should discuss this with her PCP as she may                        need a GI referral at that time or evaluation for need                        for colonoscopy earlier than her recommended screening                        colonoscopy.                       In addition, if patient's family history of colon                        cancer changes (no family history at this time) in the                        future, earlier screening may be indicated and patient                        should discuss this with PCP as well. Procedure Code(s):    --- Professional ---                       L2440, Colorectal cancer screening; colonoscopy on                        individual  not meeting criteria for high risk Diagnosis Code(s):    --- Professional ---                       Z12.11, Encounter for screening for malignant neoplasm                        of colon CPT copyright 2017 [employer]. All rights reserved. The codes documented in this report are preliminary and upon coder review may  be revised to meet current compliance requirements.  Vonda Antigua, MD Margretta Sidle B. Bonna Gains MD, MD 09/14/2018 2:45:36 PM This report has been signed electronically. Number of Addenda: 0 Note Initiated On: 09/14/2018 2:03 PM Scope Withdrawal Time: 0 hours 11 minutes 19 seconds  Total Procedure Duration: 0 hours 18 minutes 53 seconds       [facility]

## 2018-09-14 NOTE — Anesthesia Procedure Notes (Signed)
Date/Time: 09/14/2018 2:24 PM Performed by: Nelda Marseille, CRNA Pre-anesthesia Checklist: Patient identified, Emergency Drugs available, Suction available, Patient being monitored and Timeout performed Oxygen Delivery Method: Nasal cannula

## 2018-09-14 NOTE — Anesthesia Post-op Follow-up Note (Signed)
Anesthesia QCDR form completed.        

## 2018-09-14 NOTE — Transfer of Care (Signed)
Immediate Anesthesia Transfer of Care Note  Patient: Briana [name]  Procedure(s) Performed: COLONOSCOPY WITH PROPOFOL (N/A )  Patient Location: PACU  Anesthesia Type:General  Level of Consciousness: awake, alert  and oriented  Airway & Oxygen Therapy: Patient Spontanous Breathing and Patient connected to nasal cannula oxygen  Post-op Assessment: Report given to RN and Post -op Vital signs reviewed and stable  Post vital signs: Reviewed and stable  Last Vitals:  Vitals Value Taken Time  BP    Temp    Pulse 98 09/14/2018  2:46 PM  Resp 22 09/14/2018  2:46 PM  SpO2 100 % 09/14/2018  2:46 PM  Vitals shown include unvalidated device data.  Last Pain:  Vitals:   09/14/18 1255  TempSrc: Tympanic  PainSc: 0-No pain         Complications: No apparent anesthesia complications

## 2019-03-02 ENCOUNTER — Other Ambulatory Visit: Payer: Self-pay

## 2019-03-02 ENCOUNTER — Ambulatory Visit (INDEPENDENT_AMBULATORY_CARE_PROVIDER_SITE_OTHER): Payer: 59 | Admitting: Obstetrics and Gynecology

## 2019-03-02 ENCOUNTER — Other Ambulatory Visit (HOSPITAL_COMMUNITY)
Admission: RE | Admit: 2019-03-02 | Discharge: 2019-03-02 | Disposition: A | Discharge status: Home / Self Care | Payer: 59 | Source: Ambulatory Visit | Attending: Obstetrics and Gynecology | Admitting: Obstetrics and Gynecology

## 2019-03-02 ENCOUNTER — Encounter: Payer: Self-pay | Admitting: Obstetrics and Gynecology

## 2019-03-02 VITALS — BP 113/73 | HR 78 | Ht 59.5 in | Wt 151.5 lb

## 2019-03-02 DIAGNOSIS — E669 Obesity, unspecified: Secondary | ICD-10-CM

## 2019-03-02 DIAGNOSIS — N951 Menopausal and female climacteric states: Secondary | ICD-10-CM

## 2019-03-02 DIAGNOSIS — Z01419 Encounter for gynecological examination (general) (routine) without abnormal findings: Secondary | ICD-10-CM

## 2019-03-02 DIAGNOSIS — Z131 Encounter for screening for diabetes mellitus: Secondary | ICD-10-CM | POA: Diagnosis not present

## 2019-03-02 DIAGNOSIS — Z809 Family history of malignant neoplasm, unspecified: Secondary | ICD-10-CM | POA: Diagnosis not present

## 2019-03-02 DIAGNOSIS — Z124 Encounter for screening for malignant neoplasm of cervix: Secondary | ICD-10-CM | POA: Diagnosis not present

## 2019-03-02 DIAGNOSIS — I10 Essential (primary) hypertension: Secondary | ICD-10-CM

## 2019-03-02 DIAGNOSIS — Z13220 Encounter for screening for lipoid disorders: Secondary | ICD-10-CM

## 2019-03-02 NOTE — Patient Instructions (Addendum)
Preventive Care 40-64 Years, Female Preventive care refers to lifestyle choices and visits with your health care provider that can promote health and wellness. What does preventive care include?   A yearly physical exam. This is also called an annual well check.  Dental exams once or twice a year.  Routine eye exams. Ask your health care provider how often you should have your eyes checked.  Personal lifestyle choices, including: ? Daily care of your teeth and gums. ? Regular physical activity. ? Eating a healthy diet. ? Avoiding tobacco and drug use. ? Limiting alcohol use. ? Practicing safe sex. ? Taking low-dose aspirin daily starting at age 50. ? Taking vitamin and mineral supplements as recommended by your health care provider. What happens during an annual well check? The services and screenings done by your health care provider during your annual well check will depend on your age, overall health, lifestyle risk factors, and family history of disease. Counseling Your health care provider may ask you questions about your:  Alcohol use.  Tobacco use.  Drug use.  Emotional well-being.  Home and relationship well-being.  Sexual activity.  Eating habits.  Work and work environment.  Method of birth control.  Menstrual cycle.  Pregnancy history. Screening You may have the following tests or measurements:  Height, weight, and BMI.  Blood pressure.  Lipid and cholesterol levels. These may be checked every 5 years, or more frequently if you are over 50 years old.  Skin check.  Lung cancer screening. You may have this screening every year starting at age 55 if you have a 30-pack-year history of smoking and currently smoke or have quit within the past 15 years.  Colorectal cancer screening. All adults should have this screening starting at age 50 and continuing until age 75. Your health care provider may recommend screening at age 45. You will have tests every  1-10 years, depending on your results and the type of screening test. People at increased risk should start screening at an earlier age. Screening tests may include: ? Guaiac-based fecal occult blood testing. ? Fecal immunochemical test (FIT). ? Stool DNA test. ? Virtual colonoscopy. ? Sigmoidoscopy. During this test, a flexible tube with a tiny camera (sigmoidoscope) is used to examine your rectum and lower colon. The sigmoidoscope is inserted through your anus into your rectum and lower colon. ? Colonoscopy. During this test, a long, thin, flexible tube with a tiny camera (colonoscope) is used to examine your entire colon and rectum.  Hepatitis C blood test.  Hepatitis B blood test.  Sexually transmitted disease (STD) testing.  Diabetes screening. This is done by checking your blood sugar (glucose) after you have not eaten for a while (fasting). You may have this done every 1-3 years.  Mammogram. This may be done every 1-2 years. Talk to your health care provider about when you should start having regular mammograms. This may depend on whether you have a family history of breast cancer.  BRCA-related cancer screening. This may be done if you have a family history of breast, ovarian, tubal, or peritoneal cancers.  Pelvic exam and Pap test. This may be done every 3 years starting at age 21. Starting at age 30, this may be done every 5 years if you have a Pap test in combination with an HPV test.  Bone density scan. This is done to screen for osteoporosis. You may have this scan if you are at high risk for osteoporosis. Discuss your test results, treatment options,   and if necessary, the need for more tests with your health care provider. Vaccines Your health care provider may recommend certain vaccines, such as:  Influenza vaccine. This is recommended every year.  Tetanus, diphtheria, and acellular pertussis (Tdap, Td) vaccine. You may need a Td booster every 10 years.  Varicella  vaccine. You may need this if you have not been vaccinated.  Zoster vaccine. You may need this after age 60.  Measles, mumps, and rubella (MMR) vaccine. You may need at least one dose of MMR if you were born in 1957 or later. You may also need a second dose.  Pneumococcal 13-valent conjugate (PCV13) vaccine. You may need this if you have certain conditions and were not previously vaccinated.  Pneumococcal polysaccharide (PPSV23) vaccine. You may need one or two doses if you smoke cigarettes or if you have certain conditions.  Meningococcal vaccine. You may need this if you have certain conditions.  Hepatitis A vaccine. You may need this if you have certain conditions or if you travel or work in places where you may be exposed to hepatitis A.  Hepatitis B vaccine. You may need this if you have certain conditions or if you travel or work in places where you may be exposed to hepatitis B.  Haemophilus influenzae type b (Hib) vaccine. You may need this if you have certain conditions. Talk to your health care provider about which screenings and vaccines you need and how often you need them. This information is not intended to replace advice given to you by your health care provider. Make sure you discuss any questions you have with your health care provider. Document Released: 12/29/2015 Document Revised: 01/22/2018 Document Reviewed: 10/03/2015 Elsevier Interactive Patient Education  2019 [employer]. Breast Self-Awareness Breast self-awareness means:  Knowing how your breasts look.  Knowing how your breasts feel.  Checking your breasts every month for changes.  Telling your doctor if you notice a change in your breasts. Breast self-awareness allows you to notice a breast problem early while it is still small. How to do a breast self-exam One way to learn what is normal for your breasts and to check for changes is to do a breast self-exam. To do a breast self-exam: Look for Changes   1. Take off all the clothes above your waist. 2. Stand in front of a mirror in a room with good lighting. 3. Put your hands on your hips. 4. Push your hands down. 5. Look at your breasts and nipples in the mirror to see if one breast or nipple looks different than the other. Check to see if: ? The shape of one breast is different. ? The size of one breast is different. ? There are wrinkles, dips, and bumps in one breast and not the other. 6. Look at each breast for changes in your skin, such as: ? Redness. ? Scaly areas. 7. Look for changes in your nipples, such as: ? Liquid around the nipples. ? Bleeding. ? Dimpling. ? Redness. ? A change in where the nipples are. Feel for Changes 1. Lie on your back on the floor. 2. Feel each breast. To do this, follow these steps: ? Pick a breast to feel. ? Put the arm closest to that breast above your head. ? Use your other arm to feel the nipple area of your breast. Feel the area with the pads of your three middle fingers by making small circles with your fingers. For the first circle, press lightly. For the second   circle, press harder. For the third circle, press even harder. ? Keep making circles with your fingers at the light, harder, and even harder pressures as you move down your breast. Stop when you feel your ribs. ? Move your fingers a little toward the center of your body. ? Start making circles with your fingers again, this time going up until you reach your collarbone. ? Keep making up and down circles until you reach your armpit. Remember to keep using the three pressures. ? Feel the other breast in the same way. 3. Sit or stand in the shower or tub. 4. With soapy water on your skin, feel each breast the same way you did in step 2, when you were lying on the floor. Write Down What You Find After doing the self-exam, write down:  What is normal for each breast.  Any changes you find in each breast.  When you last had your period.   How often should I check my breasts? Check your breasts every month. If you are breastfeeding, the best time to check them is after you feed your baby or after you use a breast pump. If you get periods, the best time to check your breasts is 5-7 days after your period is over. When should I see my doctor? See your doctor if you notice:  A change in shape or size of your breasts or nipples.  A change in the skin of your breast or nipples, such as red or scaly skin.  Unusual fluid coming from your nipples.  A lump or thick area that was not there before.  Pain in your breasts.  Anything that concerns you. This information is not intended to replace advice given to you by your health care provider. Make sure you discuss any questions you have with your health care provider. Document Released: 05/20/2008 Document Revised: 05/09/2016 Document Reviewed: 10/22/2015 Elsevier Interactive Patient Education  2019 [employer] Inc.  

## 2019-03-02 NOTE — Progress Notes (Signed)
PT is present today for her annual exam. Pt stated that she has been doing self-breast exams monthly. Pt stated that she is doing well and denies any issues. No problems or concerns.     

## 2019-03-02 NOTE — Progress Notes (Signed)
GYNECOLOGY ANNUAL EXAM CLINIC VISIT Subjective:    Briana [name] is a 53 y.o. [phone] female who presents to establish care and for an annual exam.  The patient is sexually active (married, 1 partner). The patient wears seatbelts: yes. The patient participates in regular exercise: no. Has the patient ever been transfused or tattooed?: no. The patient reports that there is not domestic violence in her life.    The patient has the following complaints today. 1. Notes hot flushes since last year.  Was taking black cohosh which helped for a while, but now not as helpful. Is doing other lifestyle maintenance treatments to help (yoga, layering clothes, adequate water intake). Notes her PCP prescribed her something that she cannot recall the name but states it was non-hormonal, has not initiated yet.   Menstrual History: Menarche: age 11. Notes periods are infrequent. Last cycle was in December.  Patient's last menstrual period was 11/27/2018. Denies h/o STIs.  Last pap smear: 12/2015.Results were normal.  Denies h/o abnormal pap smears.  Last mammogram: 09/2018 (performed at [facility]). Results were;: normal.  Last colonoscopy: 09/14/2018.  Results were: negative.    OB History  Gravida Para Term Preterm AB Living  6 2 2   4 2   SAB TAB Ectopic Multiple Live Births               # Outcome Date GA Lbr Len/2nd Weight Sex Delivery Anes PTL Lv  6 Term 1996     Vag-Spont     5 Term 1987     Vag-Spont     4 AB           3 AB           2 AB           1 AB              Past Medical History:  Diagnosis Date  . Adjustment disorder with anxiety 10/13/2014  . 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 Asthma   . Essential (primary) hypertension 09/22/2014  . Fibroids, intramural 10/13/2014  . Headache, menstrual migraine 10/13/2014     Family History  Problem Relation Age of Onset  . Hypertension Mother   . Breast cancer Mother 61  . Heart attack Father   . Breast cancer Cousin   .  Colon cancer Cousin     Past Surgical History:  Procedure Laterality Date  . COLONOSCOPY WITH PROPOFOL N/A 09/14/2018   Procedure: COLONOSCOPY WITH PROPOFOL;  Surgeon: Virgel Manifold, MD;  Location: [facility] ENDOSCOPY;  Service: Endoscopy;  Laterality: N/A;  . COSMETIC SURGERY      Social History   Socioeconomic History  . Marital status: Single    Spouse name: Not on file  . Number of children: Not on file  . Years of education: Not on file  . Highest education level: Not on file  Occupational History  . Not on file  Social Needs  . Financial resource strain: Not on file  . Food insecurity:    Worry: Not on file    Inability: Not on file  . Transportation needs:    Medical: Not on file    Non-medical: Not on file  Tobacco Use  . Smoking status: Never Smoker  . Smokeless tobacco: Never Used  Substance and Sexual Activity  . Alcohol use: Yes    Comment: occ  . Drug use: No  . Sexual activity: Yes    Comment: partner surgical  Lifestyle  .  Physical activity:    Days per week: Not on file    Minutes per session: Not on file  . Stress: Not on file  Relationships  . Social connections:    Talks on phone: Not on file    Gets together: Not on file    Attends religious service: Not on file    Active member of club or organization: Not on file    Attends meetings of clubs or organizations: Not on file    Relationship status: Not on file  . Intimate partner violence:    Fear of current or ex partner: Not on file    Emotionally abused: Not on file    Physically abused: Not on file    Forced sexual activity: Not on file  Other Topics Concern  . Not on file  Social History Narrative  . Not on file    Outpatient Encounter Medications as of 03/02/2019  Medication Sig Note  . albuterol (PROVENTIL HFA) 108 (90 Base) MCG/ACT inhaler Inhale 2 puffs into the lungs every 6 (six) hours as needed.   Marland Kitchen amLODipine (NORVASC) 10 MG tablet TAKE 1 TABLET (10 MG TOTAL) BY MOUTH  DAILY. 12/19/2015: Received from: External Pharmacy  . fluticasone (FLONASE) 50 MCG/ACT nasal spray Place into both nostrils daily.   Marland Kitchen KLOR-CON M20 20 MEQ tablet Take 20 mEq by mouth daily.   Marland Kitchen loratadine (CLARITIN) 10 MG tablet Take 10 mg by mouth. 12/19/2015: Received from: [facility]  . losartan (COZAAR) 25 MG tablet Take 25 mg by mouth daily.   . Prenatal Vit-Fe Fumarate-FA (PRENATAL MULTIVITAMIN) TABS tablet Take 1 tablet by mouth daily at 12 noon.   Marland Kitchen BLACK COHOSH PO Take by mouth daily.   . Fluticasone-Salmeterol (ADVAIR DISKUS) 100-50 MCG/DOSE AEPB Inhale 1 puff into the lungs 2 (two) times daily.   . [DISCONTINUED] QNASL 80 MCG/ACT AERS Place 2 sprays into both nostrils daily. 12/19/2015: Received from: External Pharmacy   No facility-administered encounter medications on file as of 03/02/2019.     No Known Allergies   Review of Systems Constitutional: negative for chills, fatigue, fevers and sweats. Positive for hot flushes Eyes: negative for irritation, redness and visual disturbance Ears, nose, mouth, throat, and face: negative for hearing loss, nasal congestion, snoring and tinnitus Respiratory: negative for asthma, cough, sputum Cardiovascular: negative for chest pain, dyspnea, exertional chest pressure/discomfort, irregular heart beat, palpitations and syncope Gastrointestinal: negative for abdominal pain, change in bowel habits, nausea and vomiting Genitourinary: negative for abnormal menstrual periods, genital lesions, sexual problems and vaginal discharge, dysuria and urinary incontinence Integument/breast: negative for breast lump, breast tenderness and nipple discharge Hematologic/lymphatic: negative for bleeding and easy bruising Musculoskeletal:negative for back pain and muscle weakness Neurological: negative for dizziness, headaches, vertigo and weakness Endocrine: negative for diabetic symptoms including polydipsia, polyuria and skin dryness Allergic/Immunologic:  negative for hay fever and urticaria     Objective:    Blood pressure 113/73, pulse 78, height 4' 11.5" (1.511 m), weight 151 lb 8 oz (68.7 kg), last menstrual period 11/27/2018. Body mass index is 30.09 kg/m.  General Appearance:    Alert, cooperative, no distress, appears stated age  Head:    Normocephalic, without obvious abnormality, atraumatic  Eyes:    PERRL, conjunctiva/corneas clear, EOM's intact, both eyes  Ears:    Normal external ear canals, both ears  Nose:   Nares normal, septum midline, mucosa normal, no drainage or sinus tenderness  Throat:   Lips, mucosa, and tongue normal; teeth  and gums normal  Neck:   Supple, symmetrical, trachea midline, no adenopathy; thyroid: no enlargement/tenderness/nodules; no carotid bruit or JVD  Back:     Symmetric, no curvature, ROM normal, no CVA tenderness  Lungs:     Clear to auscultation bilaterally, respirations unlabored  Chest Wall:    No tenderness or deformity   Heart:    Regular rate and rhythm, S1 and S2 normal, no murmur, rub or gallop  Breast Exam:    No tenderness, masses, or nipple abnormality  Abdomen:     Soft, non-tender, bowel sounds active all four quadrants, no masses, no organomegaly.  Well healed low transverse abdominal scar (tummy tuck).    Genitalia:    Pelvic:external genitalia normal, vagina without lesions, discharge, or tenderness, rectovaginal septum  normal. Cervix normal in appearance, no cervical motion tenderness, no adnexal masses or tenderness. Uterus normal size, shape (~ 12 cm), mobile, nontender.   Rectal:    Normal external sphincter.  No hemorrhoids appreciated. Internal exam not done.   Extremities:   Extremities normal, atraumatic, no cyanosis or edema  Pulses:   2+ and symmetric all extremities  Skin:   Skin color, texture, turgor normal, no rashes or lesions  Lymph nodes:   Cervical, supraclavicular, and axillary nodes normal  Neurologic:   CNII-XII intact, normal strength, sensation and reflexes  throughout    Labs:  Lab Results  Component Value Date   WBC 4.5 02/25/2018   HGB 14.0 02/25/2018   HCT 41.4 02/25/2018   MCV 88 02/25/2018   PLT 285 02/25/2018      Chemistry      Component Value Date/Time   NA 142 02/25/2018 1013   K 3.6 02/25/2018 1013   CL 104 02/25/2018 1013   CO2 23 02/25/2018 1013   BUN 3 (L) 02/25/2018 1013   CREATININE 0.60 02/25/2018 1013      Component Value Date/Time   CALCIUM 9.6 02/25/2018 1013   ALKPHOS 95 02/25/2018 1013   AST 25 02/25/2018 1013   ALT 25 02/25/2018 1013   BILITOT <0.2 02/25/2018 1013       Assessment:   Healthy female exam.   HTN Obesity Family history of cancer Perimenopausal hot flushes  Plan:    Labs: Hemoglobin A1c, Lipid panel  Breast self exam technique reviewed and patient encouraged to perform self-exam monthly. Contraception: None.   Discussed healthy lifestyle modifications. Pap smear up to date.   Up to date on immunizations.  Has PCP to manage HTN.  Patient has had [employer] gentic testing for family history of cancer, negative.  Colon cancer screening ordered.  Mammogram up to date.  To continue yearly screening.  Has mammograms performed in [facility].  Follow up in 1 year, or as needed.     Rubie Maid, MD [facility] 03/02/2019 8:38 AM

## 2019-03-03 LAB — LIPID PANEL
CHOLESTEROL TOTAL: 203 mg/dL — AB (ref 100–199)
Chol/HDL Ratio: 2.9 ratio (ref 0.0–4.4)
HDL: 71 mg/dL (ref >39)
LDL CALC: 118 mg/dL — AB (ref 0–99)
TRIGLYCERIDES: 72 mg/dL (ref 0–149)
VLDL CHOLESTEROL CAL: 14 mg/dL (ref 5–40)

## 2019-03-03 LAB — HEMOGLOBIN A1C
Est. average glucose Bld gHb Est-mCnc: 111 mg/dL
Hgb A1c MFr Bld: 5.5 % (ref 4.8–5.6)

## 2019-03-05 LAB — CYTOLOGY - PAP
Adequacy: ABSENT
DIAGNOSIS: NEGATIVE
HPV (WINDOPATH): NOT DETECTED

## 2019-07-06 ENCOUNTER — Other Ambulatory Visit: Payer: Self-pay

## 2019-07-06 ENCOUNTER — Encounter: Payer: Self-pay | Admitting: Internal Medicine

## 2019-07-06 ENCOUNTER — Ambulatory Visit: Payer: 59 | Admitting: Internal Medicine

## 2019-07-06 VITALS — BP 116/62 | HR 85 | Temp 97.3°F | Ht 59.5 in | Wt 148.2 lb

## 2019-07-06 DIAGNOSIS — R002 Palpitations: Secondary | ICD-10-CM

## 2019-07-06 DIAGNOSIS — G4733 Obstructive sleep apnea (adult) (pediatric): Secondary | ICD-10-CM | POA: Diagnosis not present

## 2019-07-06 NOTE — Patient Instructions (Signed)
REFERRAL TO CARDIOLOGY FOR PALPITATIONS  EXERCISE AS TOLERATED  CONTINUE ADVAIR ON A DAILY BASIS RINSE MOUTH OUT AFTER EVERY USE  WATCH YOUR DIET  CONTINUE CPAP

## 2019-07-06 NOTE — Progress Notes (Signed)
[facility] Consultation      Patient synopsis Establish care several years ago for sleep apnea and mild asthma AHI 8  PFT'S 07/2016 RATIO 82% FEV1 81% DLCO 85% Fef25/75 midl small airways obstructive disease  6MWT 1410 FEET 97%    CHIEF COMPLAINT:   Follow-up asthma Follow-up sleep apnea palpitations +palpitations  HISTORY OF PRESENT ILLNESS   Having palpitations at rest associated with diet Having hot flashes Having issues using CPAP at night Intermittent wheezing No fevers No cough Uses advair intermittently  Feels better with advair as needed Uses nasal sprays and antihiatmines  I reviewed Sleep study with patient AHI around 8 Compliance 20%  Patient dx with ASTHMA when she was 20 No ER visit and no intubations No prednisone in years no signs of infection at this time        Current Medication:  Current Outpatient Medications:  .  albuterol (PROVENTIL HFA) 108 (90 Base) MCG/ACT inhaler, Inhale 2 puffs into the lungs every 6 (six) hours as needed., Disp: , Rfl:  .  amLODipine (NORVASC) 10 MG tablet, TAKE 1 TABLET (10 MG TOTAL) BY MOUTH DAILY., Disp: , Rfl: 0 .  BLACK COHOSH PO, Take by mouth daily., Disp: , Rfl:  .  fluticasone (FLONASE) 50 MCG/ACT nasal spray, Place into both nostrils daily., Disp: , Rfl:  .  Fluticasone-Salmeterol (ADVAIR DISKUS) 100-50 MCG/DOSE AEPB, Inhale 1 puff into the lungs 2 (two) times daily., Disp: 1 each, Rfl: 6 .  KLOR-CON M20 20 MEQ tablet, Take 20 mEq by mouth daily., Disp: , Rfl: 5 .  loratadine (CLARITIN) 10 MG tablet, Take 10 mg by mouth., Disp: , Rfl:  .  losartan (COZAAR) 25 MG tablet, Take 25 mg by mouth daily., Disp: , Rfl:  .  Prenatal Vit-Fe Fumarate-FA (PRENATAL MULTIVITAMIN) TABS tablet, Take 1 tablet by mouth daily at 12 noon., Disp: , Rfl:     ALLERGIES   Patient has no known allergies.     REVIEW OF SYSTEMS   Review of Systems  Constitutional: Negative for chills,  diaphoresis, fever, malaise/fatigue and weight loss.  HENT: Negative for congestion.   Eyes: Negative for blurred vision and double vision.  Respiratory: Negative for cough, hemoptysis, sputum production, shortness of breath and wheezing.   Cardiovascular: Negative for chest pain, palpitations, orthopnea and leg swelling.  Gastrointestinal: Negative for heartburn and nausea.  Musculoskeletal: Negative for neck pain.  Neurological: Negative for weakness.  All other systems reviewed and are negative.  BP 116/62 (BP Location: Left Arm, Cuff Size: Normal)   Pulse 85   Temp (!) 97.3 F (36.3 C) (Temporal)   Ht 4' 11.5" (1.511 m)   Wt 148 lb 3.2 oz (67.2 kg)   SpO2 97%   BMI 29.43 kg/m    .vs   PHYSICAL EXAM  Physical Exam  Constitutional: She is oriented to person, place, and time. She appears well-developed and well-nourished. No distress.  Neck: Neck supple.  Cardiovascular: Normal rate, regular rhythm and normal heart sounds.  No murmur heard. Pulmonary/Chest: Effort normal and breath sounds normal. No stridor. No respiratory distress. She has no wheezes. She has no rales.  Musculoskeletal: Normal range of motion.        General: No edema.  Neurological: She is alert and oriented to person, place, and time. No cranial nerve deficit.  Skin: Skin is warm. She is not diaphoretic.  Psychiatric: She has a normal mood and affect.      ASSESSMENT/PLAN   52  yo pleasant AAF with OSA, mild intermittent ASTHMA with palpitations  Palpitations follow up cardiology needed   OSA-needs to increase compliance New supplies needed   ASTHMA mild intermittent-needs to start ADVAIR as prescribed Albuterol as needed Avoid allergens  Allergic rhinitis Continue antihistamine as prescribed    COVID-19 EDUCATION: The signs and symptoms of COVID-19 were discussed with the patient and how to seek care for testing.  The importance of social distancing was discussed today. Hand Washing  Techniques and avoid touching face was advised.  MEDICATION ADJUSTMENTS/LABS AND TESTS ORDERED: Cardiology consultation   CURRENT MEDICATIONS REVIEWED AT LENGTH WITH PATIENT TODAY   Patient satisfied with Plan of action and management. All questions answered  Follow up in 3 months   Aurea Aronov [name], M.D.  [facility]  Medical Director [place] Director [facility] Cardio-Pulmonary Department

## 2019-08-11 ENCOUNTER — Ambulatory Visit: Payer: 59 | Admitting: Internal Medicine

## 2019-08-19 ENCOUNTER — Ambulatory Visit: Payer: 59 | Admitting: Cardiovascular Disease

## 2019-08-19 ENCOUNTER — Encounter: Payer: Self-pay | Admitting: Cardiovascular Disease

## 2019-08-19 ENCOUNTER — Other Ambulatory Visit: Payer: Self-pay

## 2019-08-19 ENCOUNTER — Ambulatory Visit (INDEPENDENT_AMBULATORY_CARE_PROVIDER_SITE_OTHER): Payer: 59

## 2019-08-19 VITALS — BP 108/80 | HR 77 | Ht 59.5 in | Wt 149.2 lb

## 2019-08-19 DIAGNOSIS — R0602 Shortness of breath: Secondary | ICD-10-CM | POA: Diagnosis not present

## 2019-08-19 DIAGNOSIS — R002 Palpitations: Secondary | ICD-10-CM

## 2019-08-19 DIAGNOSIS — I10 Essential (primary) hypertension: Secondary | ICD-10-CM

## 2019-08-19 DIAGNOSIS — E7800 Pure hypercholesterolemia, unspecified: Secondary | ICD-10-CM

## 2019-08-19 NOTE — Patient Instructions (Signed)
Medication Instructions:  Your physician recommends that you continue on your current medications as directed. Please refer to the Current Medication list given to you today.  If you need a refill on your cardiac medications before your next appointment, please call your pharmacy.   Lab work: None ordered If you have labs (blood work) drawn today and your tests are completely normal, you will receive your results only by: Marland Kitchen MyChart Message (if you have MyChart) OR . A paper copy in the mail If you have any lab test that is abnormal or we need to change your treatment, we will call you to review the results.  Testing/Procedures: Your physician has requested that you have an echocardiogram. Echocardiography is a painless test that uses sound waves to create images of your heart. It provides your doctor with information about the size and shape of your heart and how well your heart's chambers and valves are working. This procedure takes approximately one hour. There are no restrictions for this procedure.  Your physician has recommended that you wear an zio monitor. Zio monitors are medical devices that record the heart's electrical activity. Doctors most often [place] these monitors to diagnose arrhythmias. Arrhythmias are problems with the speed or rhythm of the heartbeat. The monitor is a small, portable device. You can wear one while you do your normal daily activities. This is usually used to diagnose what is causing palpitations/syncope (passing out).  Dr. Fletcher Anon has ordered a CT coronary calcium score. This test is done at [address]. This is $150 out of pocket.   Coronary CalciumScan A coronary calcium scan is an imaging test used to look for deposits of calcium and other fatty materials (plaques) in the inner lining of the blood vessels of the heart (coronary arteries). These deposits of calcium and plaques can partly clog and narrow the coronary arteries without producing  any symptoms or warning signs. This puts a person at risk for a heart attack. This test can detect these deposits before symptoms develop. Tell a health care provider about:  Any allergies you have.  All medicines you are taking, including vitamins, herbs, eye drops, creams, and over-the-counter medicines.  Any problems you or family members have had with anesthetic medicines.  Any blood disorders you have.  Any surgeries you have had.  Any medical conditions you have.  Whether you are pregnant or may be pregnant. What are the risks? Generally, this is a safe procedure. However, problems may occur, including:  Harm to a pregnant woman and her unborn baby. This test involves the use of radiation. Radiation exposure can be dangerous to a pregnant woman and her unborn baby. If you are pregnant, you generally should not have this procedure done.  Slight increase in the risk of cancer. This is because of the radiation involved in the test. What happens before the procedure? No preparation is needed for this procedure. What happens during the procedure?  You will undress and remove any jewelry around your neck or chest.  You will put on a hospital gown.  Sticky electrodes will be placed on your chest. The electrodes will be connected to an electrocardiogram (ECG) machine to record a tracing of the electrical activity of your heart.  A CT scanner will take pictures of your heart. During this time, you will be asked to lie still and hold your breath for 2-3 seconds while a picture of your heart is being taken. The procedure may vary among health  care providers and hospitals. What happens after the procedure?  You can get dressed.  You can return to your normal activities.  It is up to you to get the results of your test. Ask your health care provider, or the department that is doing the test, when your results will be ready. Summary  A coronary calcium scan is an imaging test used  to look for deposits of calcium and other fatty materials (plaques) in the inner lining of the blood vessels of the heart (coronary arteries).  Generally, this is a safe procedure. Tell your health care provider if you are pregnant or may be pregnant.  No preparation is needed for this procedure.  A CT scanner will take pictures of your heart.  You can return to your normal activities after the scan is done. This information is not intended to replace advice given to you by your health care provider. Make sure you discuss any questions you have with your health care provider. Document Released: 05/30/2008 Document Revised: 10/21/2016 Document Reviewed: 10/21/2016 Elsevier Interactive Patient Education  2017 [place]: At [facility], you and your health needs are our priority.  As part of our continuing mission to provide you with exceptional heart care, we have created designated Provider Care Teams.  These Care Teams include your primary Cardiologist (physician) and Advanced Practice Providers (APPs -  Physician Assistants and Nurse Practitioners) who all work together to provide you with the care you need, when you need it. You will need a follow up appointment in 2 months.   You may see No primary care provider on file. Dr. Fletcher Anon  or one of the following Advanced Practice Providers on your designated Care Team:   Murray Hodgkins, NP Christell Faith, PA-C . Marrianne Mood, PA-C  Any Other Special Instructions Will Be Listed Below (If Applicable).  Your physician has recommended that you wear a Zio monitor. This monitor is a medical device that records the heart's electrical activity. Doctors most often use these monitors to diagnose arrhythmias. Arrhythmias are problems with the speed or rhythm of the heartbeat. The monitor is a small device applied to your chest. You can wear one while you do your normal daily activities. While wearing this monitor if you have any  symptoms to push the button and record what you felt. Once you have worn this monitor for the period of time provider prescribed (Usually 14 days), you will return the monitor device in the postage paid box. Once it is returned they will download the data collected and provide [place] with a report which the provider will then review and we will call you with those results. Important tips:  1. Avoid showering during the first 24 hours of wearing the monitor. 2. Avoid excessive sweating to help maximize wear time. 3. Do not submerge the device, no hot tubs, and no swimming pools. 4. Keep any lotions or oils away from the patch. 5. After 24 hours you may shower with the patch on. Take brief showers with your back facing the shower head.  6. Do not remove patch once it has been placed because that will interrupt data and decrease adhesive wear time. 7. Push the button when you have any symptoms and write down what you were feeling. 8. Once you have completed wearing your monitor, remove and place into box which has postage paid and place in your outgoing mailbox.  9. If for some reason you have misplaced your box  then call our office and we can provide another box and/or mail it off for you.

## 2019-08-19 NOTE — Progress Notes (Signed)
Cardiology Office Note   Date:  08/19/2019   ID:  Briana [name], DOB 04/24/1966, MRN [id number]  PCP:  Briana Peers, MD  Cardiologist:   Briana Sacramento, MD   Chief Complaint  Patient presents with  . other    Palpitations. Meds reviewed verbally with pt.      History of Present Illness: Briana [name] is a 53 y.o. female who was referred by Dr. Mortimer [name] for evaluation of palpitations.  She has been followed by me for her palpitations and hypertension.   She has prolonged history of hypertension.  She was seen last year for worsening control of blood pressure with associated hot flashes, palpitations and fatigue.  That was in the setting of transient hyperthyroidism that subsequently improved with improvement in symptoms.  She also had hypokalemia and thus secondary hypertension was suspected.  She underwent aldosterone [name] renin ratio which was normal.  Renal artery duplex showed no evidence of renal artery stenosis. She is followed by Dr. Mortimer [name] for sleep apnea and mild asthma.  She recently complained [name] him of worsening palpitations with associated hot flashes.  Thus, she was referred for reevaluation. She reports almost daily palpitations and tachycardia with mild shortness of breath.  No chest pain.  No dizziness, syncope or presyncope. She is also interested in knowing her cardiovascular atherosclerotic risk given that her father died of myocardial infarction at the age of 51.   Past Medical History:  Diagnosis Date  . Adjustment disorder with anxiety 10/13/2014  . 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 Asthma   . Essential (primary) hypertension 09/22/2014  . Fibroids, intramural 10/13/2014  . Headache, menstrual migraine 10/13/2014    Past Surgical History:  Procedure Laterality Date  . COLONOSCOPY WITH PROPOFOL N/A 09/14/2018   Procedure: COLONOSCOPY WITH PROPOFOL;  Surgeon: Virgel Manifold, MD;  Location: [facility] ENDOSCOPY;  Service: Endoscopy;  Laterality: N/A;  .  COSMETIC SURGERY       Current Outpatient Medications  Medication Sig Dispense Refill  . albuterol (PROVENTIL HFA) 108 (90 Base) MCG/ACT inhaler Inhale 2 puffs into the lungs every 6 (six) hours as needed.    Marland Kitchen amLODipine (NORVASC) 10 MG tablet TAKE 1 TABLET (10 MG TOTAL) BY MOUTH DAILY.  0  . BLACK COHOSH PO Take by mouth daily.    Marland Kitchen doxycycline (VIBRAMYCIN) 100 MG capsule 2 (two) times daily.    . fluticasone (FLONASE) 50 MCG/ACT nasal spray Place into both nostrils daily.    . Fluticasone-Salmeterol (ADVAIR DISKUS) 100-50 MCG/DOSE AEPB Inhale 1 puff into the lungs 2 (two) times daily. 1 each 6  . KLOR-CON M20 20 MEQ tablet Take 20 mEq by mouth daily.  5  . loratadine (CLARITIN) 10 MG tablet Take 10 mg by mouth.    . losartan (COZAAR) 25 MG tablet Take 25 mg by mouth daily.    . predniSONE (DELTASONE) 20 MG tablet     . Prenatal Vit-Fe Fumarate-FA (PRENATAL MULTIVITAMIN) TABS tablet Take 1 tablet by mouth daily at 12 noon.     No current facility-administered medications for this visit.     Allergies:   Patient has no known allergies.    Social History:  The patient  reports that she has never smoked. She has never used smokeless tobacco. She reports current alcohol use. She reports that she does not use drugs.   Family History:  The patient's family history includes Breast cancer in her cousin; Breast cancer (age of onset: 67) in her mother;  Colon cancer in her cousin; Heart attack in her father; Hypertension in her mother.    ROS:  Please see the history of present illness.   Otherwise, review of systems are positive for none.   All other systems are reviewed and negative.    PHYSICAL EXAM: VS:  BP 108/80 (BP Location: Left Arm, Patient Position: Sitting, Cuff Size: Normal)   Pulse 77   Ht 4' 11.5" (1.511 m)   Wt 149 lb 4 oz (67.7 kg)   SpO2 99%   BMI 29.64 kg/m  , BMI Body mass index is 29.64 kg/m. GEN: Well nourished, well developed, in no acute distress  HEENT: normal   Neck: no JVD, carotid bruits, or masses Cardiac: RRR; no murmurs, rubs, or gallops,no edema  Respiratory:  clear [name] auscultation bilaterally, normal work of breathing GI: soft, nontender, nondistended, + BS MS: no deformity or atrophy  Skin: warm and dry, no rash Neuro:  Strength and sensation are intact Psych: euthymic mood, full affect   EKG:  EKG is ordered today. The ekg ordered today demonstrates normal sinus rhythm with nonspecific T wave changes.   Recent Labs: No results found for requested labs within last 8760 hours.    Lipid Panel    Component Value Date/Time   CHOL 203 (H) 03/02/2019 0905   TRIG 72 03/02/2019 0905   HDL 71 03/02/2019 0905   CHOLHDL 2.9 03/02/2019 0905   LDLCALC 118 (H) 03/02/2019 0905      Wt Readings from Last 3 Encounters:  08/19/19 149 lb 4 oz (67.7 kg)  07/06/19 148 lb 3.2 oz (67.2 kg)  03/02/19 151 lb 8 oz (68.7 kg)       PAD Screen 05/22/2018  Previous PAD dx? No  Previous surgical procedure? Yes  Dates of procedures 2007  Pain with walking? No  Feet/toe relief with dangling? No  Painful, non-healing ulcers? No  Extremities discolored? No      ASSESSMENT AND PLAN:  1.  Palpitations: Could be due [name] premature beats in the setting of going through menopause.  I requested a 1 week outpatient monitor.  Given associated shortness of breath, I requested an echocardiogram [name] ensure no structural heart abnormalities.  2.  Screening for atherosclerotic cardiovascular disease: She is concerned about her family history.  In addition, she has mild hyperlipidemia and wants [name] know how aggressive she needs [name] be.  She has no symptoms suggestive of angina and thus there is very little diagnostic utility for stress testing.  I think the best test is coronary calcium score for risk stratification and she wants [name] proceed with this.  3.  Hypertension: Blood pressure is controlled on current medications.  Work-up for secondary hypertension last  year was negative.    4.  Hyperlipidemia: Her LDL was 118.  Treatment will be decided based on the results of coronary calcium score.  Disposition:   FU with me in 2-3 months  Signed,  Briana Sacramento, MD  08/19/2019 12:57 PM    [place]

## 2019-08-31 ENCOUNTER — Other Ambulatory Visit: Payer: Self-pay | Admitting: Cardiovascular Disease

## 2019-08-31 DIAGNOSIS — R0602 Shortness of breath: Secondary | ICD-10-CM

## 2019-09-03 ENCOUNTER — Other Ambulatory Visit: Payer: Self-pay | Admitting: *Deleted

## 2019-09-03 DIAGNOSIS — R002 Palpitations: Secondary | ICD-10-CM

## 2019-09-07 ENCOUNTER — Telehealth: Payer: Self-pay

## 2019-09-07 NOTE — Telephone Encounter (Signed)
Patient made aware of cardiac monitor results with verbalized understanding.

## 2019-09-07 NOTE — Telephone Encounter (Signed)
-----   Message from Wellington Hampshire, MD sent at 09/06/2019  4:57 PM EDT ----- Inform patient that monitor was fine.  She had only 1 short run of SVT that lasted 5 beats.  This is not serious and not long enough to require treatment.

## 2019-09-10 ENCOUNTER — Other Ambulatory Visit: Payer: Self-pay

## 2019-09-10 ENCOUNTER — Ambulatory Visit (INDEPENDENT_AMBULATORY_CARE_PROVIDER_SITE_OTHER): Payer: 59

## 2019-09-10 DIAGNOSIS — R0602 Shortness of breath: Secondary | ICD-10-CM

## 2019-09-17 ENCOUNTER — Other Ambulatory Visit: Payer: Self-pay

## 2019-09-17 ENCOUNTER — Ambulatory Visit (INDEPENDENT_AMBULATORY_CARE_PROVIDER_SITE_OTHER)
Admission: RE | Admit: 2019-09-17 | Discharge: 2019-09-17 | Disposition: A | Discharge status: Home / Self Care | Payer: Self-pay | Source: Ambulatory Visit | Attending: Cardiovascular Disease | Admitting: Cardiovascular Disease

## 2019-09-17 DIAGNOSIS — R002 Palpitations: Secondary | ICD-10-CM

## 2019-09-17 DIAGNOSIS — R0602 Shortness of breath: Secondary | ICD-10-CM

## 2019-09-21 ENCOUNTER — Telehealth: Payer: Self-pay

## 2019-09-21 NOTE — Telephone Encounter (Signed)
Patient made aware of echo and Cor CT calcium score results with verbalized understanding.

## 2019-09-21 NOTE — Telephone Encounter (Signed)
-----   Message from Wellington Hampshire, MD sent at 09/13/2019  2:21 PM EDT ----- Inform patient that echo was normal.

## 2019-09-21 NOTE — Telephone Encounter (Signed)
Patient returning call for results 

## 2019-09-21 NOTE — Telephone Encounter (Signed)
Patient echo and CT calcium score results have not been reviewed in mychart. Called the patient to give her results. [email or web address].

## 2019-10-22 ENCOUNTER — Ambulatory Visit (INDEPENDENT_AMBULATORY_CARE_PROVIDER_SITE_OTHER): Payer: 59 | Admitting: Cardiovascular Disease

## 2019-10-22 ENCOUNTER — Other Ambulatory Visit: Payer: Self-pay

## 2019-10-22 ENCOUNTER — Encounter: Payer: Self-pay | Admitting: Cardiovascular Disease

## 2019-10-22 VITALS — BP 110/78 | Ht 60.0 in | Wt 147.0 lb

## 2019-10-22 DIAGNOSIS — E785 Hyperlipidemia, unspecified: Secondary | ICD-10-CM | POA: Diagnosis not present

## 2019-10-22 DIAGNOSIS — R002 Palpitations: Secondary | ICD-10-CM | POA: Diagnosis not present

## 2019-10-22 DIAGNOSIS — I10 Essential (primary) hypertension: Secondary | ICD-10-CM | POA: Diagnosis not present

## 2019-10-22 NOTE — Progress Notes (Signed)
Cardiology Office Note   Date:  10/22/2019   ID:  [name], DOB Dec 19, 1965, MRN [id number]  PCP:  Briana Peers, MD  Cardiologist:   Briana Sacramento, MD   Chief Complaint  Patient presents with  . Other    2 month follow up. patient deniess chest pain and SOB at this time.       History of Present Illness: Briana [name] is a 53 y.o. female who is here today for follow-up visit regarding palpitations.   She has prolonged history of hypertension.  She was seen last year for worsening control of blood pressure with associated hot flashes, palpitations and fatigue.  That was in the setting of transient hyperthyroidism that subsequently improved with improvement in symptoms.  She also had hypokalemia and thus secondary hypertension was suspected.  She underwent aldosterone to renin ratio which was normal.  Renal artery duplex showed no evidence of renal artery stenosis. She is followed by Dr. Mortimer Fries for sleep apnea and mild asthma.    She was seen recently for palpitations associated with hot flashes.  She underwent a 7-day Zio patch monitor which showed 5 beat run of SVT with rare PACs and PVCs and no other significant arrhythmia. Echocardiogram was normal. Given her family history of coronary artery disease, she underwent coronary calcium score which came back normal.  She is doing well with no recurrent palpitations.  No chest pain or shortness of breath.  She is being more active and started a new exercise program in September.  Past Medical History:  Diagnosis Date  . Adjustment disorder with anxiety 10/13/2014  . 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 Asthma   . Essential (primary) hypertension 09/22/2014  . Fibroids, intramural 10/13/2014  . Headache, menstrual migraine 10/13/2014    Past Surgical History:  Procedure Laterality Date  . COLONOSCOPY WITH PROPOFOL N/A 09/14/2018   Procedure: COLONOSCOPY WITH PROPOFOL;  Surgeon: Virgel Manifold, MD;  Location:  [facility] ENDOSCOPY;  Service: Endoscopy;  Laterality: N/A;  . COSMETIC SURGERY       Current Outpatient Medications  Medication Sig Dispense Refill  . albuterol (PROVENTIL HFA) 108 (90 Base) MCG/ACT inhaler Inhale 2 puffs into the lungs every 6 (six) hours as needed.    Marland Kitchen amLODipine (NORVASC) 10 MG tablet TAKE 1 TABLET (10 MG TOTAL) BY MOUTH DAILY.  0  . BLACK COHOSH PO Take by mouth daily.    . fluticasone (FLONASE) 50 MCG/ACT nasal spray Place into both nostrils daily.    Marland Kitchen KLOR-CON M20 20 MEQ tablet Take 20 mEq by mouth daily.  5  . loratadine (CLARITIN) 10 MG tablet Take 10 mg by mouth.    . losartan (COZAAR) 25 MG tablet Take 25 mg by mouth daily.    . Prenatal Vit-Fe Fumarate-FA (PRENATAL MULTIVITAMIN) TABS tablet Take 1 tablet by mouth daily at 12 noon.    . Fluticasone-Salmeterol (ADVAIR DISKUS) 100-50 MCG/DOSE AEPB Inhale 1 puff into the lungs 2 (two) times daily. 1 each 6  . predniSONE (DELTASONE) 20 MG tablet      No current facility-administered medications for this visit.     Allergies:   Patient has no known allergies.    Social History:  The patient  reports that she has never smoked. She has never used smokeless tobacco. She reports current alcohol use. She reports that she does not use drugs.   Family History:  The patient's family history includes Breast cancer in her cousin; Breast cancer (age of  onset: 28) in her mother; Colon cancer in her cousin; Heart attack in her father; Hypertension in her mother.    ROS:  Please see the history of present illness.   Otherwise, review of systems are positive for none.   All other systems are reviewed and negative.    PHYSICAL EXAM: VS:  BP 110/78 (BP Location: Left Arm, Patient Position: Sitting, Cuff Size: Normal)   Ht 5' (1.524 m)   Wt 147 lb (66.7 kg)   BMI 28.71 kg/m  , BMI Body mass index is 28.71 kg/m. GEN: Well nourished, well developed, in no acute distress  HEENT: normal  Neck: no JVD, carotid bruits, or masses  Cardiac: RRR; no murmurs, rubs, or gallops,no edema  Respiratory:  clear to auscultation bilaterally, normal work of breathing GI: soft, nontender, nondistended, + BS MS: no deformity or atrophy  Skin: warm and dry, no rash Neuro:  Strength and sensation are intact Psych: euthymic mood, full affect   EKG:  EKG is not ordered today.    Recent Labs: No results found for requested labs within last 8760 hours.    Lipid Panel    Component Value Date/Time   CHOL 203 (H) 03/02/2019 0905   TRIG 72 03/02/2019 0905   HDL 71 03/02/2019 0905   CHOLHDL 2.9 03/02/2019 0905   LDLCALC 118 (H) 03/02/2019 0905      Wt Readings from Last 3 Encounters:  10/22/19 147 lb (66.7 kg)  08/19/19 149 lb 4 oz (67.7 kg)  07/06/19 148 lb 3.2 oz (67.2 kg)       PAD Screen 05/22/2018  Previous PAD dx? No  Previous surgical procedure? Yes  Dates of procedures 2007  Pain with walking? No  Feet/toe relief with dangling? No  Painful, non-healing ulcers? No  Extremities discolored? No      ASSESSMENT AND PLAN:  1.  Palpitations: Likely due to short runs of SVT.  Episodes are overall short-lived and not happening frequently enough to require treatment.  Echocardiogram was normal.  2.  Screening for atherosclerotic cardiovascular disease: Coronary calcium score was 0 which is reassuring.  3.  Hypertension: Blood pressure is controlled on current medications.  Work-up for secondary hypertension last year was negative.    4.  Hyperlipidemia: Her LDL was 118.  Given that coronary calcium score was 0, no need to start a statin at the present time.  Continue with healthy lifestyle changes.   Disposition:   FU with me as needed for recurrent symptoms.   Signed,  Briana Sacramento, MD  10/22/2019 2:38 PM    [place]

## 2019-10-22 NOTE — Patient Instructions (Signed)
Medication Instructions:  Your physician recommends that you continue on your current medications as directed. Please refer to the Current Medication list given to you today.  *If you need a refill on your cardiac medications before your next appointment, please call your pharmacy*  Lab Work: None ordered If you have labs (blood work) drawn today and your tests are completely normal, you will receive your results only by: Marland Kitchen MyChart Message (if you have MyChart) OR . A paper copy in the mail If you have any lab test that is abnormal or we need to change your treatment, we will call you to review the results.  Testing/Procedures: None ordered  Follow-Up: At [facility], you and your health needs are our priority.  As part of our continuing mission to provide you with exceptional heart care, we have created designated Provider Care Teams.  These Care Teams include your primary Cardiologist (physician) and Advanced Practice Providers (APPs -  Physician Assistants and Nurse Practitioners) who all work together to provide you with the care you need, when you need it.  Your next appointment:   As needed  The format for your next appointment:   In Person  Provider:    You may see  Dr. Fletcher Anon  or one of the following Advanced Practice Providers on your designated Care Team:    Murray Hodgkins, NP  Christell Faith, PA-C  Marrianne Mood, PA-C   Other Instructions N/A

## 2020-03-01 NOTE — Progress Notes (Signed)
Pt present for annual exam. Pt stated that she was doing well. Pt declined flu vaccine.  

## 2020-03-02 ENCOUNTER — Other Ambulatory Visit: Payer: Self-pay

## 2020-03-02 ENCOUNTER — Encounter: Payer: Self-pay | Admitting: Obstetrics and Gynecology

## 2020-03-02 ENCOUNTER — Ambulatory Visit (INDEPENDENT_AMBULATORY_CARE_PROVIDER_SITE_OTHER): Payer: 59 | Admitting: Obstetrics and Gynecology

## 2020-03-02 VITALS — BP 109/76 | HR 65 | Ht 60.0 in | Wt 150.8 lb

## 2020-03-02 DIAGNOSIS — E663 Overweight: Secondary | ICD-10-CM

## 2020-03-02 DIAGNOSIS — I10 Essential (primary) hypertension: Secondary | ICD-10-CM

## 2020-03-02 DIAGNOSIS — Z809 Family history of malignant neoplasm, unspecified: Secondary | ICD-10-CM

## 2020-03-02 DIAGNOSIS — Z1231 Encounter for screening mammogram for malignant neoplasm of breast: Secondary | ICD-10-CM

## 2020-03-02 DIAGNOSIS — Z01419 Encounter for gynecological examination (general) (routine) without abnormal findings: Secondary | ICD-10-CM | POA: Diagnosis not present

## 2020-03-02 DIAGNOSIS — N951 Menopausal and female climacteric states: Secondary | ICD-10-CM

## 2020-03-02 NOTE — Progress Notes (Signed)
GYNECOLOGY ANNUAL EXAM CLINIC VISIT Subjective:    Briana [name] is a 54 y.o. [phone] female who presents to establish care and for an annual exam.  The patient has no complaints today. The patient is sexually active (married, 1 partner). The patient wears seatbelts: yes. The patient participates in regular exercise: no. Has the patient ever been transfused or tattooed?: no. The patient reports that there is not domestic violence in her life.    Sharunda reports that she is intentionally losing weight. Using Herbal[employer] supplements and exercising. Has been using for the past 6 weeks.    Menstrual History: Menarche: age 71.  Patient's last menstrual period was 01/29/2020.  Notes periods are infrequent.  Denies h/o STIs.  Last pap smear: 03/02/2019.Results were normal.  Denies h/o abnormal pap smears.  Last mammogram: 09/15/2019. Results were;: normal.  Last colonoscopy: 09/14/2018.  Results were: negative.    OB History  Gravida Para Term Preterm AB Living  6 2 2   4 2   SAB TAB Ectopic Multiple Live Births               # Outcome Date GA Lbr Len/2nd Weight Sex Delivery Anes PTL Lv  6 Term 1996     Vag-Spont     5 Term 1987     Vag-Spont     4 AB           3 AB           2 AB           1 AB              Past Medical History:  Diagnosis Date  . Adjustment disorder with anxiety 10/13/2014  . 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 Asthma   . Essential (primary) hypertension 09/22/2014  . Fibroids, intramural 10/13/2014  . Headache, menstrual migraine 10/13/2014     Family History  Problem Relation Age of Onset  . Hypertension Mother   . Breast cancer Mother 47  . Heart attack Father   . Breast cancer Cousin   . Colon cancer Cousin     Past Surgical History:  Procedure Laterality Date  . COLONOSCOPY WITH PROPOFOL N/A 09/14/2018   Procedure: COLONOSCOPY WITH PROPOFOL;  Surgeon: Virgel Manifold, MD;  Location: [facility] ENDOSCOPY;  Service: Endoscopy;  Laterality: N/A;    . COSMETIC SURGERY      Social History   Socioeconomic History  . Marital status: Single    Spouse name: Not on file  . Number of children: Not on file  . Years of education: Not on file  . Highest education level: Not on file  Occupational History  . Not on file  Tobacco Use  . Smoking status: Never Smoker  . Smokeless tobacco: Never Used  Substance and Sexual Activity  . Alcohol use: Yes    Comment: occ  . Drug use: No  . Sexual activity: Yes    Comment: partner surgical  Other Topics Concern  . Not on file  Social History Narrative  . Not on file   Social Determinants of Health   Financial Resource Strain:   . Difficulty of Paying Living Expenses:   Food Insecurity:   . Worried About Charity fundraiser in the Last Year:   . Arboriculturist in the Last Year:   Transportation Needs:   . Film/video editor (Medical):   Marland Kitchen Lack of Transportation (Non-Medical):   Physical Activity:   . Days  of Exercise per Week:   . Minutes of Exercise per Session:   Stress:   . Feeling of Stress :   Social Connections:   . Frequency of Communication with Friends and Family:   . Frequency of Social Gatherings with Friends and Family:   . Attends Religious Services:   . Active Member of Clubs or Organizations:   . Attends Archivist Meetings:   Marland Kitchen Marital Status:   Intimate Partner Violence:   . Fear of Current or Ex-Partner:   . Emotionally Abused:   Marland Kitchen Physically Abused:   . Sexually Abused:     Outpatient Encounter Medications as of 03/02/2020  Medication Sig Note  . albuterol (PROVENTIL HFA) 108 (90 Base) MCG/ACT inhaler Inhale 2 puffs into the lungs every 6 (six) hours as needed.   Marland Kitchen amLODipine (NORVASC) 10 MG tablet TAKE 1 TABLET (10 MG TOTAL) BY MOUTH DAILY. 12/19/2015: Received from: External Pharmacy  . EVENING PRIMROSE OIL PO Take by mouth.   . fluticasone (FLONASE) 50 MCG/ACT nasal spray Place into both nostrils daily.   . Fluticasone-Salmeterol (ADVAIR  DISKUS) 100-50 MCG/DOSE AEPB Inhale 1 puff into the lungs 2 (two) times daily.   Marland Kitchen KLOR-CON M20 20 MEQ tablet Take 20 mEq by mouth daily.   Marland Kitchen loratadine (CLARITIN) 10 MG tablet Take 10 mg by mouth. 12/19/2015: Received from: [facility]  . losartan (COZAAR) 25 MG tablet Take 25 mg by mouth daily.   . Prenatal Vit-Fe Fumarate-FA (PRENATAL MULTIVITAMIN) TABS tablet Take 1 tablet by mouth daily at 12 noon.   Marland Kitchen BLACK COHOSH PO Take by mouth daily.   . predniSONE (DELTASONE) 20 MG tablet     No facility-administered encounter medications on file as of 03/02/2020.    No Known Allergies   Review of Systems Constitutional: negative for chills, fatigue, fevers and sweats. Positive for hot flushes (managed with evening primrose oil).  Eyes: negative for irritation, redness and visual disturbance Ears, nose, mouth, throat, and face: negative for hearing loss, nasal congestion, snoring and tinnitus Respiratory: negative for asthma, cough, sputum Cardiovascular: negative for chest pain, dyspnea, exertional chest pressure/discomfort, irregular heart beat, palpitations and syncope Gastrointestinal: negative for abdominal pain, change in bowel habits, nausea and vomiting Genitourinary: negative for abnormal menstrual periods, genital lesions, sexual problems and vaginal discharge, dysuria and urinary incontinence Integument/breast: negative for breast lump, breast tenderness and nipple discharge Hematologic/lymphatic: negative for bleeding and easy bruising Musculoskeletal:negative for back pain and muscle weakness Neurological: negative for dizziness, headaches, vertigo and weakness Endocrine: negative for diabetic symptoms including polydipsia, polyuria and skin dryness Allergic/Immunologic: negative for hay fever and urticaria     Objective:    Blood pressure 109/76, pulse 65, height 5' (1.524 m), weight 150 lb 12.8 oz (68.4 kg), last menstrual period 01/29/2020. Body mass index is 29.45  kg/m.  General Appearance:    Alert, cooperative, no distress, appears stated age, overweight  Head:    Normocephalic, without obvious abnormality, atraumatic  Eyes:    PERRL, conjunctiva/corneas clear, EOM's intact, both eyes  Ears:    Normal external ear canals, both ears  Nose:   Nares normal, septum midline, mucosa normal, no drainage or sinus tenderness  Throat:   Lips, mucosa, and tongue normal; teeth and gums normal  Neck:   Supple, symmetrical, trachea midline, no adenopathy; thyroid: no enlargement/tenderness/nodules; no carotid bruit or JVD  Back:     Symmetric, no curvature, ROM normal, no CVA tenderness  Lungs:  Clear to auscultation bilaterally, respirations unlabored  Chest Wall:    No tenderness or deformity   Heart:    Regular rate and rhythm, S1 and S2 normal, no murmur, rub or gallop  Breast Exam:    No tenderness, masses, or nipple abnormality  Abdomen:     Soft, non-tender, bowel sounds active all four quadrants, no masses, no organomegaly.  Well healed low transverse abdominal scar (tummy tuck).    Genitalia:    Pelvic:external genitalia normal, vagina without lesions, discharge, or tenderness, rectovaginal septum  normal. Cervix normal in appearance, no cervical motion tenderness, no adnexal masses or tenderness. Uterus normal size, shape (~ 12 cm), mobile, nontender.   Rectal:    Normal external sphincter.  No hemorrhoids appreciated. Internal exam not done.   Extremities:   Extremities normal, atraumatic, no cyanosis or edema  Pulses:   2+ and symmetric all extremities  Skin:   Skin color, texture, turgor normal, no rashes or lesions  Lymph nodes:   Cervical, supraclavicular, and axillary nodes normal  Neurologic:   CNII-XII intact, normal strength, sensation and reflexes throughout    Labs:  Had labs by PCP.    Assessment:   Healthy female exam.   HTN Obesity Family history of cancer Perimenopausal hot flushes  Plan:    Labs: None ordered. Has had  labs with PCP.   Breast self exam technique reviewed and patient encouraged to perform self-exam monthly. Contraception: None.   Discussed healthy lifestyle modifications. Pap smear up to date. Up to date on immunizations.  Has PCP to manage HTN.  Patient has had [employer] gentic testing for family history of cancer, negative.  Colon cancer screening up to date.  Mammogram up to date.  To continue yearly screening.  Has mammograms performed in [facility].  Follow up in 1 year, or as needed.     Rubie Maid, MD [facility] 03/02/2020 8:33 AM

## 2020-03-02 NOTE — Patient Instructions (Addendum)

## 2021-03-08 ENCOUNTER — Encounter: Payer: 59 | Admitting: Obstetrics and Gynecology

## 2021-04-17 ENCOUNTER — Encounter: Payer: 59 | Admitting: Obstetrics and Gynecology

## 2021-04-17 DIAGNOSIS — Z01419 Encounter for gynecological examination (general) (routine) without abnormal findings: Secondary | ICD-10-CM

## 2021-04-23 ENCOUNTER — Encounter: Payer: Self-pay | Admitting: Obstetrics and Gynecology

## 2021-06-28 ENCOUNTER — Encounter: Payer: 59 | Admitting: Obstetrics and Gynecology

## 2021-06-28 ENCOUNTER — Other Ambulatory Visit: Payer: Self-pay

## 2021-06-28 ENCOUNTER — Ambulatory Visit (INDEPENDENT_AMBULATORY_CARE_PROVIDER_SITE_OTHER): Payer: 59 | Admitting: Obstetrics and Gynecology

## 2021-06-28 ENCOUNTER — Encounter: Payer: Self-pay | Admitting: Obstetrics and Gynecology

## 2021-06-28 VITALS — BP 113/77 | HR 80 | Ht 60.0 in | Wt 148.4 lb

## 2021-06-28 DIAGNOSIS — N951 Menopausal and female climacteric states: Secondary | ICD-10-CM | POA: Diagnosis not present

## 2021-06-28 DIAGNOSIS — Z809 Family history of malignant neoplasm, unspecified: Secondary | ICD-10-CM

## 2021-06-28 DIAGNOSIS — E663 Overweight: Secondary | ICD-10-CM

## 2021-06-28 DIAGNOSIS — Z1231 Encounter for screening mammogram for malignant neoplasm of breast: Secondary | ICD-10-CM | POA: Diagnosis not present

## 2021-06-28 DIAGNOSIS — Z01419 Encounter for gynecological examination (general) (routine) without abnormal findings: Secondary | ICD-10-CM | POA: Diagnosis not present

## 2021-06-28 DIAGNOSIS — I10 Essential (primary) hypertension: Secondary | ICD-10-CM

## 2021-06-28 NOTE — Progress Notes (Signed)
Pt present for annual exam. Pt stated that she was doing  well.

## 2021-06-28 NOTE — Progress Notes (Signed)
GYNECOLOGY ANNUAL EXAM CLINIC VISIT Subjective:    Briana [name] is a 55 y.o. [phone] female who presents  for an annual exam. She denies any major complaints. The patient is sexually active (1 partner). The patient wears seatbelts: yes. The patient participates in regular exercise: no. Has the patient ever been transfused or tattooed?: no. The patient reports that there is not domestic violence in her life.    The patient desires to note the following today:   Notes that she has finally gone through menopause, has had 1 full year of no cycles. Using evening primrose oil for hot flushes.  Plans on getting married in October.    Menstrual History: Menarche: age 46.  Patient's last menstrual period was 06/04/2020 (exact date).  Denies h/o STIs.  Last pap smear: 03/02/2019.Results were normal.  Denies h/o abnormal pap smears.  Last mammogram: 10/28/2020. Results were;: normal.  Last colonoscopy: 09/14/2018.  Results were: negative.    OB History  Gravida Para Term Preterm AB Living  6 2 2   4 2   SAB IAB Ectopic Multiple Live Births               # Outcome Date GA Lbr Len/2nd Weight Sex Delivery Anes PTL Lv  6 Term 1996     Vag-Spont     5 Term 1987     Vag-Spont     4 AB           3 AB           2 AB           1 AB              Past Medical History:  Diagnosis Date   Adjustment disorder with anxiety 10/13/2014   Airway hyperreactivity 10/13/2014   Asthma    Essential (primary) hypertension 09/22/2014   Fibroids, intramural 10/13/2014   Headache, menstrual migraine 10/13/2014     Family History  Problem Relation Age of Onset   Hypertension Mother    Breast cancer Mother 54   Heart attack Father    Breast cancer Cousin    Colon cancer Cousin     Past Surgical History:  Procedure Laterality Date   COLONOSCOPY WITH PROPOFOL N/A 09/14/2018   Procedure: COLONOSCOPY WITH PROPOFOL;  Surgeon: Virgel Manifold, MD;  Location: [facility] ENDOSCOPY;  Service: Endoscopy;   Laterality: N/A;   COSMETIC SURGERY      Social History   Socioeconomic History   Marital status: Single    Spouse name: Not on file   Number of children: Not on file   Years of education: Not on file   Highest education level: Not on file  Occupational History   Not on file  Tobacco Use   Smoking status: Never   Smokeless tobacco: Never  Vaping Use   Vaping Use: Never used  Substance and Sexual Activity   Alcohol use: Yes    Comment: occ   Drug use: No   Sexual activity: Yes    Comment: partner surgical  Other Topics Concern   Not on file  Social History Narrative   Not on file   Social Determinants of Health   Financial Resource Strain: Not on file  Food Insecurity: Not on file  Transportation Needs: Not on file  Physical Activity: Not on file  Stress: Not on file  Social Connections: Not on file  Intimate Partner Violence: Not on file    Outpatient Encounter Medications as  of 06/28/2021  Medication Sig Note   albuterol (VENTOLIN HFA) 108 (90 Base) MCG/ACT inhaler Inhale 2 puffs into the lungs every 6 (six) hours as needed.    amLODipine (NORVASC) 10 MG tablet TAKE 1 TABLET (10 MG TOTAL) BY MOUTH DAILY. 12/19/2015: Received from: External Pharmacy   EVENING PRIMROSE OIL PO Take by mouth.    fluticasone (FLONASE) 50 MCG/ACT nasal spray Place into both nostrils daily.    Fluticasone-Salmeterol (ADVAIR DISKUS) 100-50 MCG/DOSE AEPB Inhale 1 puff into the lungs 2 (two) times daily.    loratadine (CLARITIN) 10 MG tablet Take 10 mg by mouth. 12/19/2015: Received from: [facility]   losartan (COZAAR) 25 MG tablet Take 25 mg by mouth daily.    Prenatal Vit-Fe Fumarate-FA (PRENATAL MULTIVITAMIN) TABS tablet Take 1 tablet by mouth daily at 12 noon.    [DISCONTINUED] BLACK COHOSH PO Take by mouth daily. (Patient not taking: Reported on 06/28/2021)    [DISCONTINUED] KLOR-CON M20 20 MEQ tablet Take 20 mEq by mouth daily. (Patient not taking: Reported on 06/28/2021)     [DISCONTINUED] predniSONE (DELTASONE) 20 MG tablet  (Patient not taking: Reported on 06/28/2021)    No facility-administered encounter medications on file as of 06/28/2021.    No Known Allergies   Review of Systems Constitutional: negative for chills, fatigue, fevers and sweats. Positive for hot flushes (managed with evening primrose oil).  Eyes: negative for irritation, redness and visual disturbance Ears, nose, mouth, throat, and face: negative for hearing loss, nasal congestion, snoring and tinnitus Respiratory: negative for asthma, cough, sputum Cardiovascular: negative for chest pain, dyspnea, exertional chest pressure/discomfort, irregular heart beat, palpitations and syncope Gastrointestinal: negative for abdominal pain, change in bowel habits, nausea and vomiting Genitourinary: negative for abnormal menstrual periods, genital lesions, sexual problems and vaginal discharge, dysuria and urinary incontinence Integument/breast: negative for breast lump, breast tenderness and nipple discharge Hematologic/lymphatic: negative for bleeding and easy bruising Musculoskeletal:negative for back pain and muscle weakness Neurological: negative for dizziness, headaches, vertigo and weakness Endocrine: negative for diabetic symptoms including polydipsia, polyuria and skin dryness Allergic/Immunologic: negative for hay fever and urticaria     Objective:    Blood pressure 113/77, pulse 80, height 5' (1.524 m), weight 148 lb 6.4 oz (67.3 kg), last menstrual period 06/04/2020. Body mass index is 28.98 kg/m.  General Appearance:    Alert, cooperative, no distress, appears stated age, overweight  Head:    Normocephalic, without obvious abnormality, atraumatic  Eyes:    PERRL, conjunctiva/corneas clear, EOM's intact, both eyes  Ears:    Normal external ear canals, both ears  Nose:   Nares normal, septum midline, mucosa normal, no drainage or sinus tenderness  Throat:   Lips, mucosa, and tongue  normal; teeth and gums normal  Neck:   Supple, symmetrical, trachea midline, no adenopathy; thyroid: no enlargement/tenderness/nodules; no carotid bruit or JVD  Back:     Symmetric, no curvature, ROM normal, no CVA tenderness  Lungs:     Clear to auscultation bilaterally, respirations unlabored  Chest Wall:    No tenderness or deformity   Heart:    Regular rate and rhythm, S1 and S2 normal, no murmur, rub or gallop  Breast Exam:    No tenderness, masses, or nipple abnormality  Abdomen:     Soft, non-tender, bowel sounds active all four quadrants, no masses, no organomegaly.  Well healed low transverse abdominal scar (tummy tuck).    Genitalia:    Pelvic:external genitalia normal, vagina without lesions, discharge, or tenderness,  rectovaginal septum  normal. Cervix normal in appearance, no cervical motion tenderness, no adnexal masses or tenderness. Uterus normal size, shape (~ 12 cm), mobile, nontender.   Rectal:    Normal external sphincter.  No hemorrhoids appreciated. Internal exam not done.   Extremities:   Extremities normal, atraumatic, no cyanosis or edema  Pulses:   2+ and symmetric all extremities  Skin:   Skin color, texture, turgor normal, no rashes or lesions  Lymph nodes:   Cervical, supraclavicular, and axillary nodes normal  Neurologic:   CNII-XII intact, normal strength, sensation and reflexes throughout    Labs:  Had labs by PCP. Reviewed in [place], performed at [facility])   Assessment:   Healthy female exam.   HTN Overweight Family history of cancer Menopausal hot flushes  Plan:    Labs:Has had labs with performed recently, reviewed in [place].  Breast self exam technique reviewed and patient encouraged to perform self-exam monthly. Contraception: None.   Discussed healthy lifestyle modifications. Pap smear up to date. Has PCP to manage HTN. Well controlled today.  Patient has had [employer] gentic testing for family history of  cancer, negative.  Colon cancer screening up to date.  Mammogram up to date.  To continue yearly screening.  Has mammograms performed in [facility].  COVID vaccination:  Follow up in 1 year, or as needed.     Rubie Maid, MD [facility] 06/28/2021 2:34 PM

## 2021-06-28 NOTE — Patient Instructions (Signed)
Breast Self-Awareness Breast self-awareness is knowing how your breasts look and feel. Doing breast self-awareness is important. It allows you to catch a breast problem early while it is still small and can be treated. All women should do breast self-awareness, including women who have had breast implants. Tell your doctorif you notice a change in your breasts. What you need: A mirror. A well-lit room. How to do a breast self-exam A breast self-exam is one way to learn what is normal for your breasts and tocheck for changes. To do a breast self-exam: Look for changes  Take off all the clothes above your waist. Stand in front of a mirror in a room with good lighting. Put your hands on your hips. Push your hands down. Look at your breasts and nipples in the mirror to see if one breast or nipple looks different from the other. Check to see if: The shape of one breast is different. The size of one breast is different. There are wrinkles, dips, and bumps in one breast and not the other. Look at each breast for changes in the skin, such as: Redness. Scaly areas. Look for changes in your nipples, such as: Liquid around the nipples. Bleeding. Dimpling. Redness. A change in where the nipples are.  Feel for changes  Lie on your back on the floor. Feel each breast. To do this, follow these steps: Pick a breast to feel. Put the arm closest to that breast above your head. Use your other arm to feel the nipple area of your breast. Feel the area with the pads of your three middle fingers by making small circles with your fingers. For the first circle, press lightly. For the second circle, press harder. For the third circle, press even harder. Keep making circles with your fingers at the different pressures as you move down your breast. Stop when you feel your ribs. Move your fingers a little toward the center of your body. Start making circles with your fingers again, this time going up until  you reach your collarbone. Keep making up-and-down circles until you reach your armpit. Remember to keep using the three pressures. Feel the other breast in the same way. Sit or stand in the tub or shower. With soapy water on your skin, feel each breast the same way you did in step 2 when you were lying on the floor.  Write down what you find Writing down what you find can help you remember what to tell your doctor. Write down: What is normal for each breast. Any changes you find in each breast, including: The kind of changes you find. Whether you have pain. Size and location of any lumps. When you last had your menstrual period. General tips Check your breasts every month. If you are breastfeeding, the best time to check your breasts is after you feed your baby or after you use a breast pump. If you get menstrual periods, the best time to check your breasts is 5-7 days after your menstrual period is over. With time, you will become comfortable with the self-exam, and you will begin to know if there are changes in your breasts. Contact a doctor if you: See a change in the shape or size of your breasts or nipples. See a change in the skin of your breast or nipples, such as red or scaly skin. Have fluid coming from your nipples that is not normal. Find a lump or thick area that was not there before. Have pain in   your breasts. Have any concerns about your breast health. Summary Breast self-awareness includes looking for changes in your breasts, as well as feeling for changes within your breasts. Breast self-awareness should be done in front of a mirror in a well-lit room. You should check your breasts every month. If you get menstrual periods, the best time to check your breasts is 5-7 days after your menstrual period is over. Let your doctor know of any changes you see in your breasts, including changes in size, changes on the skin, pain or tenderness, or fluid from your nipples that is  not normal. This information is not intended to replace advice given to you by your health care provider. Make sure you discuss any questions you have with your healthcare provider. Document Revised: 07/21/2018 Document Reviewed: 07/21/2018 Elsevier Patient Education  2022 Elsevier Inc.     Preventive Care 27-71 Years Old, Female Preventive care refers to lifestyle choices and visits with your health care provider that can promote health and wellness. This includes: A yearly physical exam. This is also called an annual wellness visit. Regular dental and eye exams. Immunizations. Screening for certain conditions. Healthy lifestyle choices, such as: Eating a healthy diet. Getting regular exercise. Not using drugs or products that contain nicotine and tobacco. Limiting alcohol use. What can I expect for my preventive care visit? Physical exam Your health care provider will check your: Height and weight. These may be used to calculate your BMI (body mass index). BMI is a measurement that tells if you are at a healthy weight. Heart rate and blood pressure. Body temperature. Skin for abnormal spots. Counseling Your health care provider may ask you questions about your: Past medical problems. Family's medical history. Alcohol, tobacco, and drug use. Emotional well-being. Home life and relationship well-being. Sexual activity. Diet, exercise, and sleep habits. Work and work Statistician. Access to firearms. Method of birth control. Menstrual cycle. Pregnancy history. What immunizations do I need?  Vaccines are usually given at various ages, according to a schedule. Your health care provider will recommend vaccines for you based on your age, medicalhistory, and lifestyle or other factors, such as travel or where you work. What tests do I need? Blood tests Lipid and cholesterol levels. These may be checked every 5 years, or more often if you are over 32 years old. Hepatitis C  test. Hepatitis B test. Screening Lung cancer screening. You may have this screening every year starting at age 53 if you have a 30-pack-year history of smoking and currently smoke or have quit within the past 15 years. Colorectal cancer screening. All adults should have this screening starting at age 19 and continuing until age 15. Your health care provider may recommend screening at age 46 if you are at increased risk. You will have tests every 1-10 years, depending on your results and the type of screening test. Diabetes screening. This is done by checking your blood sugar (glucose) after you have not eaten for a while (fasting). You may have this done every 1-3 years. Mammogram. This may be done every 1-2 years. Talk with your health care provider about when you should start having regular mammograms. This may depend on whether you have a family history of breast cancer. BRCA-related cancer screening. This may be done if you have a family history of breast, ovarian, tubal, or peritoneal cancers. Pelvic exam and Pap test. This may be done every 3 years starting at age 42. Starting at age 15, this may be done every  5 years if you have a Pap test in combination with an HPV test. Other tests STD (sexually transmitted disease) testing, if you are at risk. Bone density scan. This is done to screen for osteoporosis. You may have this scan if you are at high risk for osteoporosis. Talk with your health care provider about your test results, treatment options,and if necessary, the need for more tests. Follow these instructions at home: Eating and drinking  Eat a diet that includes fresh fruits and vegetables, whole grains, lean protein, and low-fat dairy products. Take vitamin and mineral supplements as recommended by your health care provider. Do not drink alcohol if: Your health care provider tells you not to drink. You are pregnant, may be pregnant, or are planning to become pregnant. If  you drink alcohol: Limit how much you have to 0-1 drink a day. Be aware of how much alcohol is in your drink. In the [place] one drink equals one 12 oz bottle of beer (355 mL), one 5 oz glass of wine (148 mL), or one 1 oz glass of hard liquor (44 mL).  Lifestyle Take daily care of your teeth and gums. Brush your teeth every morning and night with fluoride toothpaste. Floss one time each day. Stay active. Exercise for at least 30 minutes 5 or more days each week. Do not use any products that contain nicotine or tobacco, such as cigarettes, e-cigarettes, and chewing tobacco. If you need help quitting, ask your health care provider. Do not use drugs. If you are sexually active, practice safe sex. Use a condom or other form of protection to prevent STIs (sexually transmitted infections). If you do not wish to become pregnant, use a form of birth control. If you plan to become pregnant, see your health care provider for a prepregnancy visit. If told by your health care provider, take low-dose aspirin daily starting at age 50. Find healthy ways to cope with stress, such as: Meditation, yoga, or listening to music. Journaling. Talking to a trusted person. Spending time with friends and family. Safety Always wear your seat belt while driving or riding in a vehicle. Do not drive: If you have been drinking alcohol. Do not ride with someone who has been drinking. When you are tired or distracted. While texting. Wear a helmet and other protective equipment during sports activities. If you have firearms in your house, make sure you follow all gun safety procedures. What's next? Visit your health care provider once a year for an annual wellness visit. Ask your health care provider how often you should have your eyes and teeth checked. Stay up to date on all vaccines. This information is not intended to replace advice given to you by your health care provider. Make sure you discuss any questions you  have with your healthcare provider. Document Revised: 09/05/2020 Document Reviewed: 08/13/2018 Elsevier Patient Education  2022 Elsevier Inc.  

## 2021-06-29 ENCOUNTER — Encounter: Payer: 59 | Admitting: Obstetrics and Gynecology

## 2021-08-03 ENCOUNTER — Telehealth: Payer: 59 | Admitting: Obstetrics and Gynecology

## 2021-08-03 NOTE — Telephone Encounter (Signed)
Pt called no answer informed pt that I would send her information via mychart concerning her call to the office. Pt was advised to read over the information and if she was having any of the need to  follow up with provider symptoms to please contact office to be scheduled.

## 2021-08-03 NOTE — Telephone Encounter (Signed)
Briana [name] called in and states that when she saw DR. Cherry at her last visit on 7/14 she was told she was in menopause.  Patient states she started bleeding/spotting on 8/18 and it is red in color.  Patient would like to know what do.  Please advise.

## 2021-08-06 IMAGING — CT CT HEART SCORING
2 series · 15 of 20 positions shown, 17 images · non-contrast
Comparison: No priors.
COMPARISON: No priors.
COMPARISON: No priors.

Addendum:
EXAM:
OVER-READ INTERPRETATION  CT CHEST

The following report is an over-read performed by radiologist Dr.
Tasha-Gay Gads [REDACTED] on 09/17/2019. This
over-read does not include interpretation of cardiac or coronary
anatomy or pathology. The coronary calcium score interpretation by
the cardiologist is attached.
CLINICAL DATA: Risk stratification
Coronary Calcium Score
TECHNIQUE: The patient was scanned on a Siemens Somatom 64 slice scanner. Axial
non-contrast 3 mm slices were carried out through the heart. The
data set was analyzed on a dedicated work station and scored using
the Agatson method.

[Series 2: casc 3.0 i36f 2 bestdiast 64 % · axial · 0.31mm/px · z∈[-225,-120]mm · 8 of 45 slices shown, 10 images]
[im 5/45  vessel]
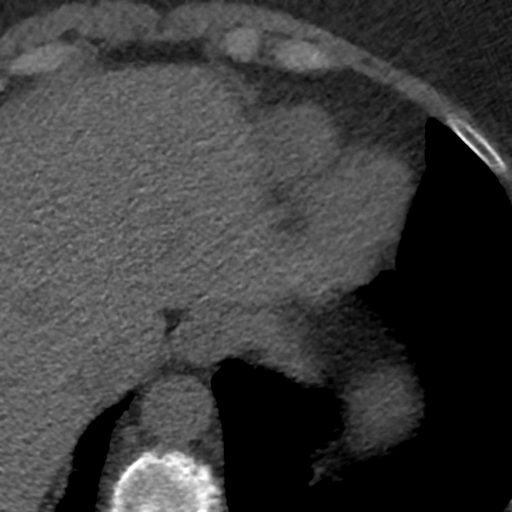
[im 5/45  lung]
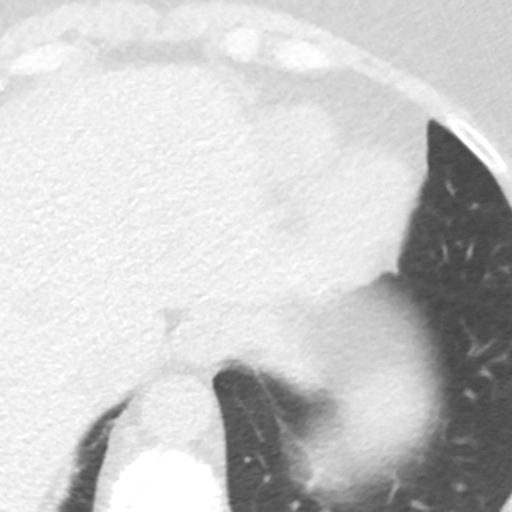
[im 10/45  vessel]
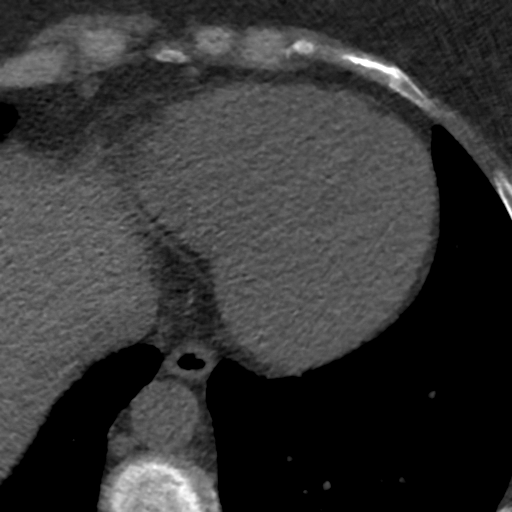
[im 15/45  vessel]
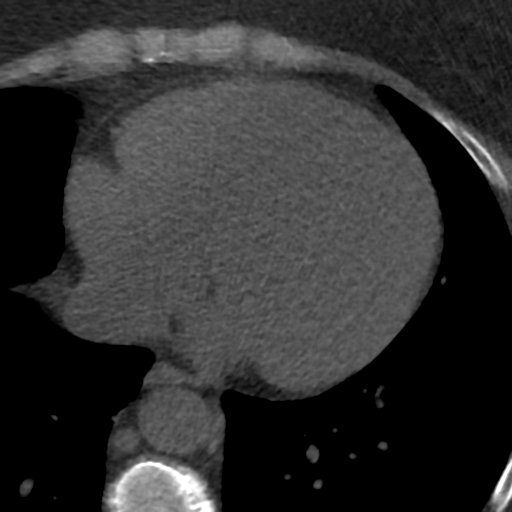
[im 20/45  vessel]
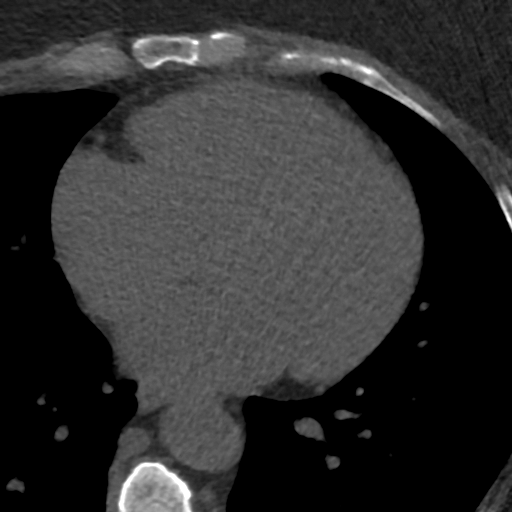
[im 25/45  vessel]
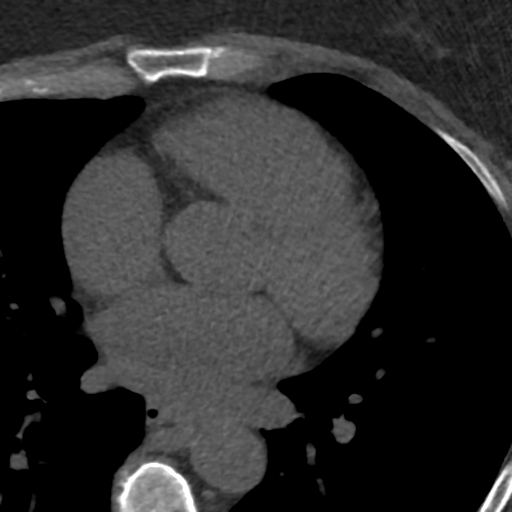
[im 25/45  lung]
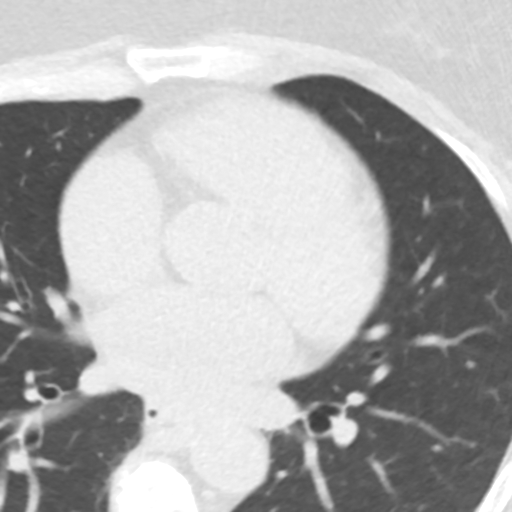
[im 30/45  vessel]
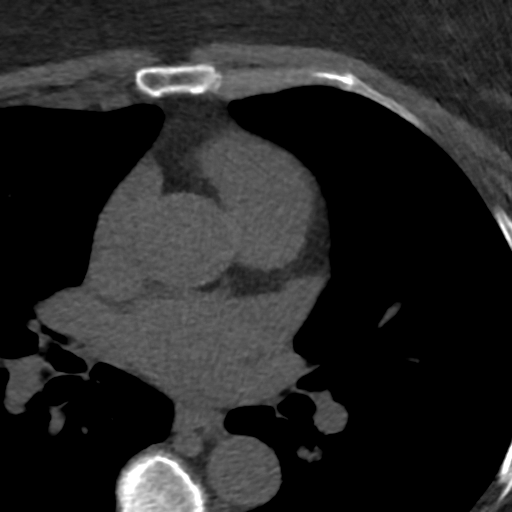
[im 35/45  vessel]
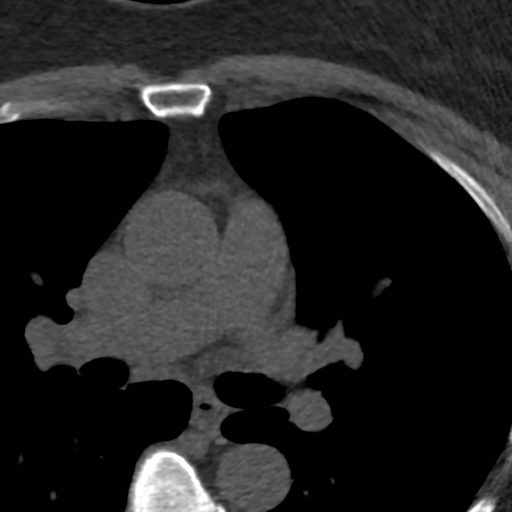
[im 40/45  vessel]
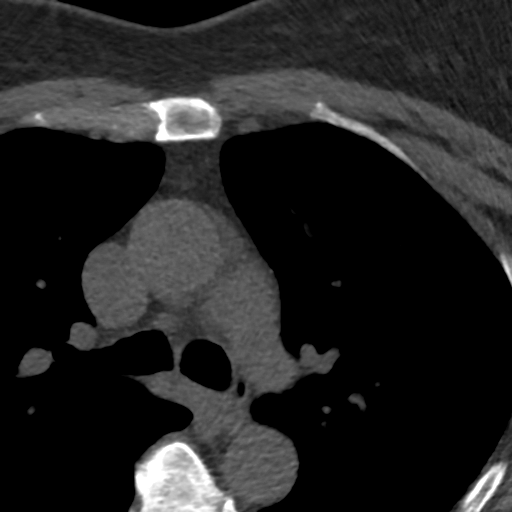

[Series 4: lung st 68 % · axial · 0.64mm/px · z∈[-225,-135]mm · 7 of 45 slices shown]
[im 5/45  lung]
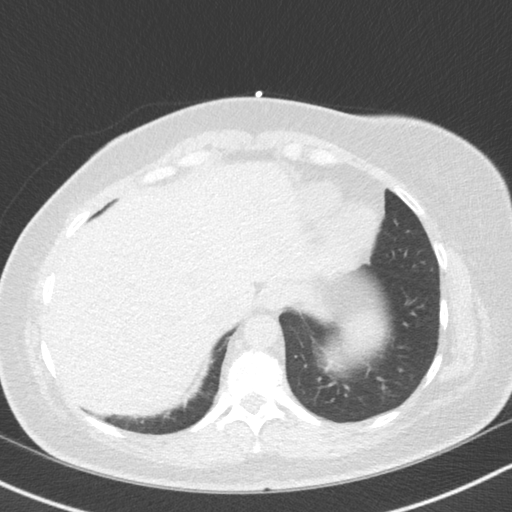
[im 10/45  lung]
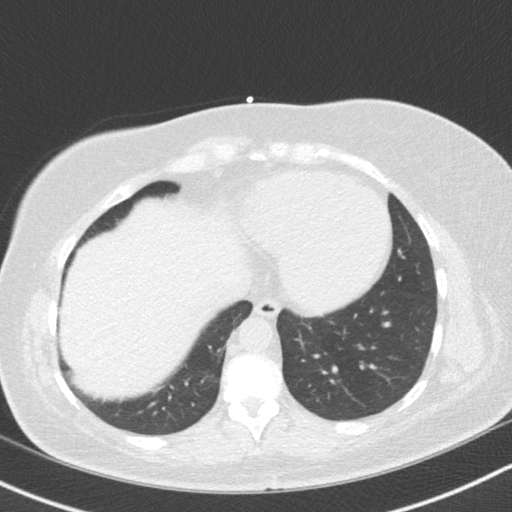
[im 15/45  lung]
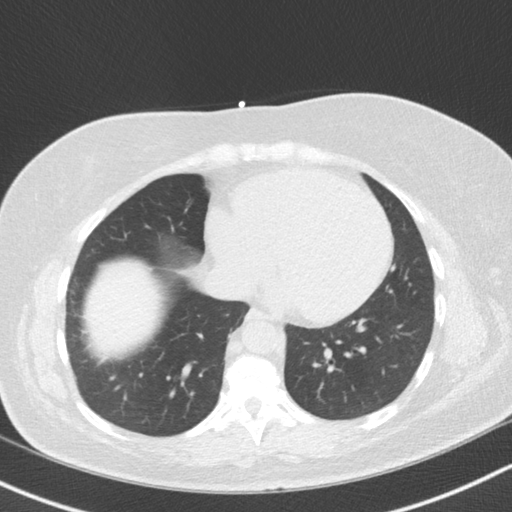
[im 20/45  lung]
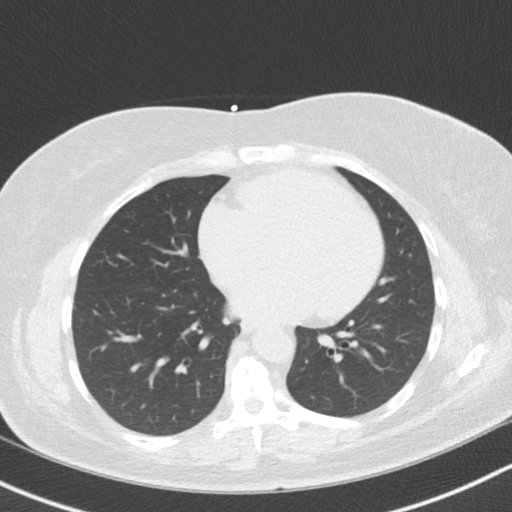
[im 25/45  lung]
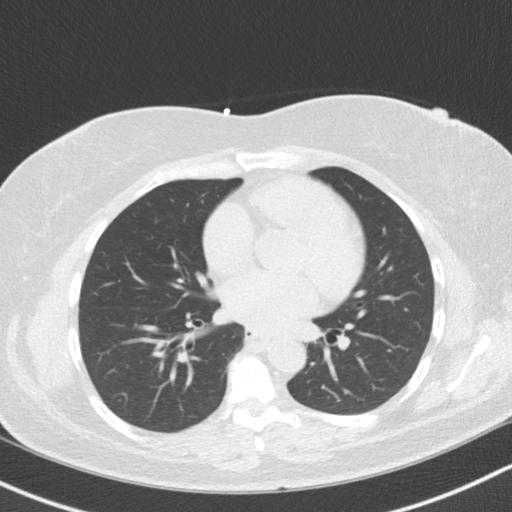
[im 30/45  lung]
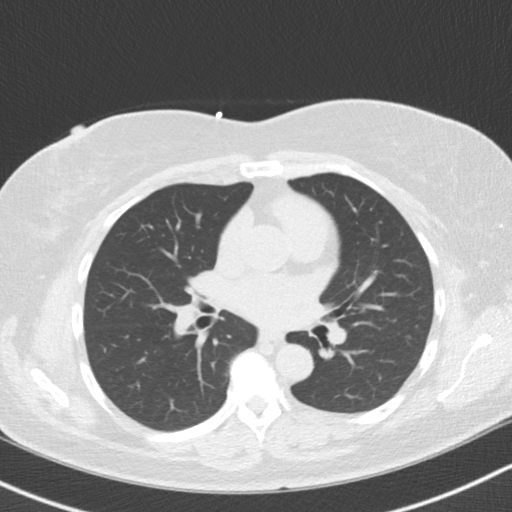
[im 35/45  lung]
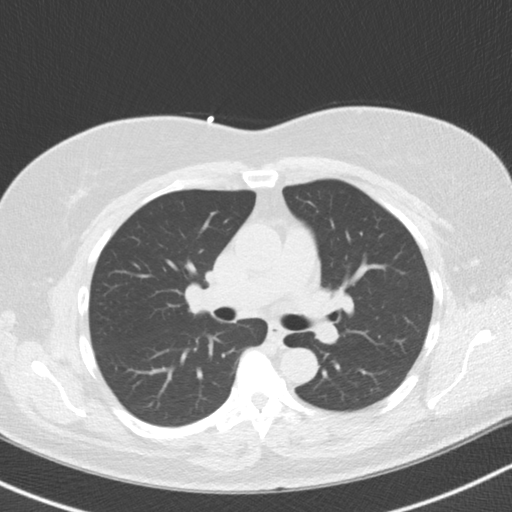

[15 of 20 positions shown; findings below may reference images not displayed]

FINDINGS: Within the visualized portions of the thorax there are no suspicious
appearing pulmonary nodules or masses, there is no acute
consolidative airspace disease, no pleural effusions, no
pneumothorax and no lymphadenopathy. Visualized portions of the
upper abdomen are unremarkable. There are no aggressive appearing
lytic or blastic lesions noted in the visualized portions of the
skeleton.
IMPRESSION: 1. No significant incidental noncardiac findings are noted.
FINDINGS: Non-cardiac: See separate report from [REDACTED].

Ascending aorta: Normal diameter 3.0 cm

Pericardium: Normal

Coronary arteries: No calcium noted
IMPRESSION: Coronary calcium score of . This was percentile for age and sex
matched control.

[REDACTED]AL DATA:  Risk stratification

EXAM:
Coronary Calcium Score
FINDINGS: Non-cardiac: See separate report from [REDACTED].

Ascending aorta: Normal diameter 3.0 cm

Pericardium: Normal

Coronary arteries: No calcium noted
IMPRESSION: Coronary calcium score of 0.

Shema Reme

*** End of Addendum ***
Addendum:
EXAM:
OVER-READ INTERPRETATION  CT CHEST

The following report is an over-read performed by radiologist Dr.
Shema Reme [REDACTED] on 09/17/2019. This
over-read does not include interpretation of cardiac or coronary
anatomy or pathology. The coronary calcium score interpretation by
the cardiologist is attached.
FINDINGS: Within the visualized portions of the thorax there are no suspicious
appearing pulmonary nodules or masses, there is no acute
consolidative airspace disease, no pleural effusions, no
pneumothorax and no lymphadenopathy. Visualized portions of the
upper abdomen are unremarkable. There are no aggressive appearing
lytic or blastic lesions noted in the visualized portions of the
skeleton.
IMPRESSION: 1. No significant incidental noncardiac findings are noted.
FINDINGS: Non-cardiac: See separate report from [REDACTED].

Ascending aorta: Normal diameter 3.0 cm

Pericardium: Normal

Coronary arteries: No calcium noted
IMPRESSION: Coronary calcium score of . This was percentile for age and sex
matched control.

Tasha-Gay Gads

*** End of Addendum ***
EXAM:
OVER-READ INTERPRETATION  CT CHEST

The following report is an over-read performed by radiologist Dr.
Shema Reme [REDACTED] on 09/17/2019. This
over-read does not include interpretation of cardiac or coronary
anatomy or pathology. The coronary calcium score interpretation by
the cardiologist is attached.
FINDINGS: Within the visualized portions of the thorax there are no suspicious
appearing pulmonary nodules or masses, there is no acute
consolidative airspace disease, no pleural effusions, no
pneumothorax and no lymphadenopathy. Visualized portions of the
upper abdomen are unremarkable. There are no aggressive appearing
lytic or blastic lesions noted in the visualized portions of the
skeleton.
IMPRESSION: 1. No significant incidental noncardiac findings are noted.

## 2021-08-10 NOTE — Telephone Encounter (Signed)
Patient has an appointment 08/28/2021 for her symptoms.

## 2021-08-28 ENCOUNTER — Ambulatory Visit: Payer: 59 | Admitting: Obstetrics and Gynecology

## 2021-08-28 ENCOUNTER — Other Ambulatory Visit: Payer: Self-pay

## 2021-08-28 ENCOUNTER — Encounter: Payer: Self-pay | Admitting: Obstetrics and Gynecology

## 2021-08-28 VITALS — BP 126/90 | HR 65 | Resp 16 | Ht 59.0 in | Wt 147.3 lb

## 2021-08-28 DIAGNOSIS — N924 Excessive bleeding in the premenopausal period: Secondary | ICD-10-CM

## 2021-08-28 NOTE — Progress Notes (Signed)
    GYNECOLOGY PROGRESS NOTE  Subjective:    Patient ID: Briana [name], female    DOB: August 10, 1966, 55 y.o.   MRN: [id number]  HPI  Patient is a 55 y.o. [id number] female who presents for vaginal bleeding. She thought that she had started menopause in June of last year as that was her last heavy period.  Did note spotting for 2 days in August 2021. She started spotting on 08/02/2021 x 7 days, spotting was very light. Not really sure what is going on with her body is she in menopause or not. Also noted that she had been having low back pain and breast tenderness.   The following portions of the patient's history were reviewed and updated as appropriate: allergies, current medications, past family history, past medical history, past social history, past surgical history, and problem list.  Review of Systems Pertinent items noted in HPI and remainder of comprehensive ROS otherwise negative.   Objective:   Blood pressure 126/90, pulse 65, resp. rate 16, height '4\' 11"'$  (1.499 m), weight 147 lb 4.8 oz (66.8 kg), last menstrual period 08/02/2021. Body mass index is 29.75 kg/m. General appearance: alert and no distress Remainder of exam deferred today. Had recent wellness exam performed by me on 06/28/2021.    Assessment:   1. Menopausal bleeding      Plan:   Will check hormone levels to assess if patient truly is in menopause. If not, can be reassured.  If she is in menopause, discussed possible etiologies, including polyps, uterine atrophy, and must rule out the possibility of hyperplasia or malignancy. Discussed that most PMB is self-limited and can be monitored, however if recurrences happen, would warrant further workup with endometrial biopsy and ultrasound. Patient notes understanding.     Rubie Maid, MD [facility]

## 2021-08-29 LAB — FSH/LH
FSH: 63.9 m[IU]/mL
LH: 32.6 m[IU]/mL

## 2021-09-07 ENCOUNTER — Telehealth (HOSPITAL_BASED_OUTPATIENT_CLINIC_OR_DEPARTMENT_OTHER): Payer: Self-pay

## 2021-12-18 ENCOUNTER — Ambulatory Visit (INDEPENDENT_AMBULATORY_CARE_PROVIDER_SITE_OTHER): Payer: 59 | Admitting: Obstetrics and Gynecology

## 2021-12-18 ENCOUNTER — Encounter: Payer: Self-pay | Admitting: Obstetrics and Gynecology

## 2021-12-18 ENCOUNTER — Other Ambulatory Visit: Payer: Self-pay

## 2021-12-18 VITALS — BP 117/80 | HR 84 | Ht 59.5 in | Wt 150.5 lb

## 2021-12-18 DIAGNOSIS — Z809 Family history of malignant neoplasm, unspecified: Secondary | ICD-10-CM

## 2021-12-18 DIAGNOSIS — E663 Overweight: Secondary | ICD-10-CM

## 2021-12-18 DIAGNOSIS — N951 Menopausal and female climacteric states: Secondary | ICD-10-CM

## 2021-12-18 DIAGNOSIS — Z01419 Encounter for gynecological examination (general) (routine) without abnormal findings: Secondary | ICD-10-CM | POA: Diagnosis not present

## 2021-12-18 DIAGNOSIS — I10 Essential (primary) hypertension: Secondary | ICD-10-CM

## 2021-12-18 NOTE — Progress Notes (Signed)
GYNECOLOGY ANNUAL EXAM CLINIC VISIT Subjective:    Briana [name] is a 56 y.o. [phone] female who presents  for an annual exam. She denies any major complaints. The patient is sexually active (1 partner). The patient wears seatbelts: yes. The patient participates in regular exercise: no. Has the patient ever been transfused or tattooed?: no. The patient reports that there is not domestic violence in her life.    The patient desires to note the following today:  Reports that she got married in October. Is very excited.    Menstrual History: Menarche: age 38.  Patient's last menstrual period was 08/02/2021 (exact date).  Patient is perimenopausal.   Denies h/o STIs.  Last pap smear: 03/02/2019.Results were normal.  Denies h/o abnormal pap smears.  Last mammogram: 10/2021. Results were;: normal.  Last colonoscopy: 09/14/2018.  Results were: negative.    OB History  Gravida Para Term Preterm AB Living  6 2 2   4 2   SAB IAB Ectopic Multiple Live Births               # Outcome Date GA Lbr Len/2nd Weight Sex Delivery Anes PTL Lv  6 Term 1996     Vag-Spont     5 Term 1987     Vag-Spont     4 AB           3 AB           2 AB           1 AB              Past Medical History:  Diagnosis Date   Adjustment disorder with anxiety 10/13/2014   Airway hyperreactivity 10/13/2014   Asthma    Essential (primary) hypertension 09/22/2014   Fibroids, intramural 10/13/2014   Headache, menstrual migraine 10/13/2014     Family History  Problem Relation Age of Onset   Hypertension Mother    Breast cancer Mother 36   Heart attack Father    Breast cancer Cousin    Colon cancer Cousin     Past Surgical History:  Procedure Laterality Date   COLONOSCOPY WITH PROPOFOL N/A 09/14/2018   Procedure: COLONOSCOPY WITH PROPOFOL;  Surgeon: Virgel Manifold, MD;  Location: [facility] ENDOSCOPY;  Service: Endoscopy;  Laterality: N/A;   COSMETIC SURGERY      Social History   Socioeconomic History    Marital status: Single    Spouse name: Not on file   Number of children: Not on file   Years of education: Not on file   Highest education level: Not on file  Occupational History   Not on file  Tobacco Use   Smoking status: Never   Smokeless tobacco: Never  Vaping Use   Vaping Use: Never used  Substance and Sexual Activity   Alcohol use: Yes    Comment: occ   Drug use: No   Sexual activity: Yes    Comment: partner surgical  Other Topics Concern   Not on file  Social History Narrative   Not on file   Social Determinants of Health   Financial Resource Strain: Not on file  Food Insecurity: Not on file  Transportation Needs: Not on file  Physical Activity: Not on file  Stress: Not on file  Social Connections: Not on file  Intimate Partner Violence: Not on file    Outpatient Encounter Medications as of 12/18/2021  Medication Sig Note   albuterol (VENTOLIN HFA) 108 (90 Base) MCG/ACT inhaler  Inhale 2 puffs into the lungs every 6 (six) hours as needed.    amLODipine (NORVASC) 10 MG tablet TAKE 1 TABLET (10 MG TOTAL) BY MOUTH DAILY. 12/19/2015: Received from: External Pharmacy   EVENING PRIMROSE OIL PO Take by mouth.    fluticasone (FLONASE) 50 MCG/ACT nasal spray Place into both nostrils daily.    loratadine (CLARITIN) 10 MG tablet Take 10 mg by mouth. 12/19/2015: Received from: [facility]   losartan (COZAAR) 25 MG tablet Take 25 mg by mouth daily.    Prenatal Vit-Fe Fumarate-FA (PRENATAL MULTIVITAMIN) TABS tablet Take 1 tablet by mouth daily at 12 noon.    Fluticasone-Salmeterol (ADVAIR DISKUS) 100-50 MCG/DOSE AEPB Inhale 1 puff into the lungs 2 (two) times daily.    No facility-administered encounter medications on file as of 12/18/2021.    No Known Allergies   Review of Systems Constitutional: negative for chills, fatigue, fevers and sweats. Positive for hot flushes, mild (managed with evening primrose oil).  Eyes: negative for irritation, redness and visual  disturbance Ears, nose, mouth, throat, and face: negative for hearing loss, nasal congestion, snoring and tinnitus Respiratory: negative for asthma, cough, sputum Cardiovascular: negative for chest pain, dyspnea, exertional chest pressure/discomfort, irregular heart beat, palpitations and syncope Gastrointestinal: negative for abdominal pain, change in bowel habits, nausea and vomiting Genitourinary: negative for abnormal menstrual periods, genital lesions, sexual problems and vaginal discharge, dysuria and urinary incontinence Integument/breast: negative for breast lump, breast tenderness and nipple discharge Hematologic/lymphatic: negative for bleeding and easy bruising Musculoskeletal:negative for back pain and muscle weakness Neurological: negative for dizziness, headaches, vertigo and weakness Endocrine: negative for diabetic symptoms including polydipsia, polyuria and skin dryness Allergic/Immunologic: negative for hay fever and urticaria     Objective:    Blood pressure 117/80, pulse 84, height 4' 11.5" (1.511 m), weight 150 lb 8 oz (68.3 kg), last menstrual period 08/02/2021. Body mass index is 29.89 kg/m.  General Appearance:    Alert, cooperative, no distress, appears stated age, overweight  Head:    Normocephalic, without obvious abnormality, atraumatic  Eyes:    PERRL, conjunctiva/corneas clear, EOM's intact, both eyes  Ears:    Normal external ear canals, both ears  Nose:   Nares normal, septum midline, mucosa normal, no drainage or sinus tenderness  Throat:   Lips, mucosa, and tongue normal; teeth and gums normal  Neck:   Supple, symmetrical, trachea midline, no adenopathy; thyroid: no enlargement/tenderness/nodules; no carotid bruit or JVD  Back:     Symmetric, no curvature, ROM normal, no CVA tenderness  Lungs:     Clear to auscultation bilaterally, respirations unlabored  Chest Wall:    No tenderness or deformity   Heart:    Regular rate and rhythm, S1 and S2 normal, no  murmur, rub or gallop  Breast Exam:    No tenderness, masses, or nipple abnormality  Abdomen:     Soft, non-tender, bowel sounds active all four quadrants, no masses, no organomegaly.  Well healed low transverse abdominal scar (tummy tuck).    Genitalia:    Pelvic:external genitalia normal, vagina without lesions, discharge, or tenderness, rectovaginal septum  normal. Cervix normal in appearance, no cervical motion tenderness, no adnexal masses or tenderness. Uterus normal size, shape (~ 12 cm), mobile, nontender.   Rectal:    Normal external sphincter.  No hemorrhoids appreciated. Internal exam not done.   Extremities:   Extremities normal, atraumatic, no cyanosis or edema  Pulses:   2+ and symmetric all extremities  Skin:  Skin color, texture, turgor normal, no rashes or lesions  Lymph nodes:   Cervical, supraclavicular, and axillary nodes normal  Neurologic:   CNII-XII intact, normal strength, sensation and reflexes throughout    Labs:  Had labs by PCP. Reviewed in [place], performed at [facility])   Assessment:   Healthy female exam  HTN Overweight Family history of cancer Perimenopausal  Plan:    Labs:Has labs with PCP. Reviewed in [place].  Breast self exam technique reviewed and patient encouraged to perform self-exam monthly. Contraception: None.  Patient is perimenopausal.  Discussed healthy lifestyle modifications. Pap smear up to date. Has PCP to manage HTN.  Patient has had [employer] gentic testing for family history of cancer, negative.  Colon cancer screening up to date.  Mammogram up to date.  To continue yearly screening.  Has mammograms performed in [facility].  COVID vaccination: declined Flu vaccine: declined Follow up in 1 year, or as needed.     Rubie Maid, MD [facility] 12/18/2021 11:46 AM

## 2021-12-18 NOTE — Patient Instructions (Signed)
Preventive Care 54-54 Years Old, Female °Preventive care refers to lifestyle choices and visits with your health care provider that can promote health and wellness. Preventive care visits are also called wellness exams. °What can I expect for my preventive care visit? °Counseling °Your health care provider may ask you questions about your: °Medical history, including: °Past medical problems. °Family medical history. °Pregnancy history. °Current health, including: °Menstrual cycle. °Method of birth control. °Emotional well-being. °Home life and relationship well-being. °Sexual activity and sexual health. °Lifestyle, including: °Alcohol, nicotine or tobacco, and drug use. °Access to firearms. °Diet, exercise, and sleep habits. °Work and work environment. °Sunscreen use. °Safety issues such as seatbelt and bike helmet use. °Physical exam °Your health care provider will check your: °Height and weight. These may be used to calculate your BMI (body mass index). BMI is a measurement that tells if you are at a healthy weight. °Waist circumference. This measures the distance around your waistline. This measurement also tells if you are at a healthy weight and may help predict your risk of certain diseases, such as type 2 diabetes and high blood pressure. °Heart rate and blood pressure. °Body temperature. °Skin for abnormal spots. °What immunizations do I need? °Vaccines are usually given at various ages, according to a schedule. Your health care provider will recommend vaccines for you based on your age, medical history, and lifestyle or other factors, such as travel or where you work. °What tests do I need? °Screening °Your health care provider may recommend screening tests for certain conditions. This may include: °Lipid and cholesterol levels. °Diabetes screening. This is done by checking your blood sugar (glucose) after you have not eaten for a while (fasting). °Pelvic exam and Pap test. °Hepatitis B test. °Hepatitis C  test. °HIV (human immunodeficiency virus) test. °STI (sexually transmitted infection) testing, if you are at risk. °Lung cancer screening. °Colorectal cancer screening. °Mammogram. Talk with your health care provider about when you should start having regular mammograms. This may depend on whether you have a family history of breast cancer. °BRCA-related cancer screening. This may be done if you have a family history of breast, ovarian, tubal, or peritoneal cancers. °Bone density scan. This is done to screen for osteoporosis. °Talk with your health care provider about your test results, treatment options, and if necessary, the need for more tests. °Follow these instructions at home: °Eating and drinking ° °Eat a diet that includes fresh fruits and vegetables, whole grains, lean protein, and low-fat dairy products. °Take vitamin and mineral supplements as recommended by your health care provider. °Do not drink alcohol if: °Your health care provider tells you not to drink. °You are pregnant, may be pregnant, or are planning to become pregnant. °If you drink alcohol: °Limit how much you have to 0-1 drink a day. °Know how much alcohol is in your drink. In the [place] one drink equals one 12 oz bottle of beer (355 mL), one 5 oz glass of wine (148 mL), or one 1½ oz glass of hard liquor (44 mL). °Lifestyle °Brush your teeth every morning and night with fluoride toothpaste. Floss one time each day. °Exercise for at least 30 minutes 5 or more days each week. °Do not use any products that contain nicotine or tobacco. These products include cigarettes, chewing tobacco, and vaping devices, such as e-cigarettes. If you need help quitting, ask your health care provider. °Do not use drugs. °If you are sexually active, practice safe sex. Use a condom or other form of protection to prevent   STIs. If you do not wish to become pregnant, use a form of birth control. If you plan to become pregnant, see your health care provider for a  prepregnancy visit. Take aspirin only as told by your health care provider. Make sure that you understand how much to take and what form to take. Work with your health care provider to find out whether it is safe and beneficial for you to take aspirin daily. Find healthy ways to manage stress, such as: Meditation, yoga, or listening to music. Journaling. Talking to a trusted person. Spending time with friends and family. Minimize exposure to UV radiation to reduce your risk of skin cancer. Safety Always wear your seat belt while driving or riding in a vehicle. Do not drive: If you have been drinking alcohol. Do not ride with someone who has been drinking. When you are tired or distracted. While texting. If you have been using any mind-altering substances or drugs. Wear a helmet and other protective equipment during sports activities. If you have firearms in your house, make sure you follow all gun safety procedures. Seek help if you have been physically or sexually abused. What's next? Visit your health care provider once a year for an annual wellness visit. Ask your health care provider how often you should have your eyes and teeth checked. Stay up to date on all vaccines. This information is not intended to replace advice given to you by your health care provider. Make sure you discuss any questions you have with your health care provider. Document Revised: 05/30/2021 Document Reviewed: 05/30/2021 Elsevier Patient Education  2022 Elsevier Inc.    Breast Self-Awareness Breast self-awareness means being familiar with how your breasts look and feel. It involves checking your breasts regularly and reporting any changes to your health care provider. Practicing breast self-awareness is important. Sometimes changes may not be harmful (are benign), but sometimes a change in your breasts can be a sign of a serious medical problem. It is important to learn how to do this procedure correctly so  that you can catch problems early, when treatment is more likely to be successful. All women should practice breast self-awareness, including women who have had breast implants. What you need: A mirror. A well-lit room. How to do a breast self-exam A breast self-exam is one way to learn what is normal for your breasts and whether your breasts are changing. To do a breast self-exam: Look for changes  Remove all the clothing above your waist. Stand in front of a mirror in a room with good lighting. Put your hands on your hips. Push your hands firmly downward. Compare your breasts in the mirror. Look for differences between them (asymmetry), such as: Differences in shape. Differences in size. Puckers, dips, and bumps in one breast and not the other. Look at each breast for changes in the skin, such as: Redness. Scaly areas. Look for changes in your nipples, such as: Discharge. Bleeding. Dimpling. Redness. A change in position. Feel for changes Carefully feel your breasts for lumps and changes. It is best to do this while lying on your back on the floor, and again while sitting or standing in the tub or shower with soapy water on your skin. Feel each breast in the following way: Place the arm on the side of the breast you are examining above your head. Feel your breast with the other hand. Start in the nipple area and make -inch (2 cm) overlapping circles to feel your breast. Use   pads of your three middle fingers to do this. Apply light pressure, then medium pressure, then firm pressure. The light pressure will allow you to feel the tissue closest to the skin. The medium pressure will allow you to feel the tissue that is a little deeper. The firm pressure will allow you to feel the tissue close to the ribs. Continue the overlapping circles, moving downward over the breast until you feel your ribs below your breast. Move one finger-width toward the center of the body. Continue to use  the -inch (2 cm) overlapping circles to feel your breast as you move slowly up toward your collarbone. Continue the up-and-down exam using all three pressures until you reach your armpit.  Write down what you find Writing down what you find can help you remember what to discuss with your health care provider. Write down: What is normal for each breast. Any changes that you find in each breast, including: The kind of changes you find. Any pain or tenderness. Size and location of any lumps. Where you are in your menstrual cycle, if you are still menstruating. General tips and recommendations Examine your breasts every month. If you are breastfeeding, the best time to examine your breasts is after a feeding or after using a breast pump. If you menstruate, the best time to examine your breasts is 5-7 days after your period. Breasts are generally lumpier during menstrual periods, and it may be more difficult to notice changes. With time and practice, you will become more familiar with the variations in your breasts and more comfortable with the exam. Contact a health care provider if you: See a change in the shape or size of your breasts or nipples. See a change in the skin of your breast or nipples, such as a reddened or scaly area. Have unusual discharge from your nipples. Find a lump or thick area that was not there before. Have pain in your breasts. Have any concerns related to your breast health. Summary Breast self-awareness includes looking for physical changes in your breasts, as well as feeling for any changes within your breasts. Breast self-awareness should be performed in front of a mirror in a well-lit room. You should examine your breasts every month. If you menstruate, the best time to examine your breasts is 5-7 days after your menstrual period. Let your health care provider know of any changes you notice in your breasts, including changes in size, changes on the skin, pain or  tenderness, or unusual fluid from your nipples. This information is not intended to replace advice given to you by your health care provider. Make sure you discuss any questions you have with your health care provider. Document Revised: 07/21/2018 Document Reviewed: 07/21/2018 Elsevier Patient Education  [place].

## 2023-12-12 LAB — HM MAMMOGRAPHY: HM Mammogram: NORMAL (ref 0–4)

## 2023-12-26 ENCOUNTER — Encounter: Payer: Self-pay | Admitting: Family Medicine

## 2023-12-26 ENCOUNTER — Ambulatory Visit (INDEPENDENT_AMBULATORY_CARE_PROVIDER_SITE_OTHER): Payer: Medicaid Other | Admitting: Family Medicine

## 2023-12-26 VITALS — BP 124/82 | HR 68 | Temp 98.4°F | Ht 59.5 in | Wt 150.1 lb

## 2023-12-26 DIAGNOSIS — R232 Flushing: Secondary | ICD-10-CM | POA: Diagnosis not present

## 2023-12-26 DIAGNOSIS — I10 Essential (primary) hypertension: Secondary | ICD-10-CM

## 2023-12-26 DIAGNOSIS — L720 Epidermal cyst: Secondary | ICD-10-CM | POA: Insufficient documentation

## 2023-12-26 DIAGNOSIS — E876 Hypokalemia: Secondary | ICD-10-CM | POA: Insufficient documentation

## 2023-12-26 DIAGNOSIS — F4322 Adjustment disorder with anxiety: Secondary | ICD-10-CM | POA: Diagnosis not present

## 2023-12-26 DIAGNOSIS — E559 Vitamin D deficiency, unspecified: Secondary | ICD-10-CM | POA: Insufficient documentation

## 2023-12-26 MED ORDER — AMLODIPINE BESYLATE 10 MG PO TABS: 10.0000 mg | ORAL_TABLET | Freq: Every day | ORAL | 1 refills | Status: DC

## 2023-12-26 MED ORDER — LOSARTAN POTASSIUM 25 MG PO TABS: 25.0000 mg | ORAL_TABLET | Freq: Every day | ORAL | 1 refills | Status: DC

## 2023-12-26 MED ORDER — POTASSIUM CHLORIDE CRYS ER 20 MEQ PO TBCR: 20.0000 meq | EXTENDED_RELEASE_TABLET | Freq: Every day | ORAL | 3 refills | Status: AC

## 2023-12-26 NOTE — Progress Notes (Addendum)
 New Patient Office Visit  Subjective    Patient ID: Briana [name], female    DOB: 03/21/1966  Age: 58 y.o. MRN: [id number]  CC:  Chief Complaint  Patient presents with   Establish Care    HPI JANEY [name] presents to establish care  Pt is here Chronic Care Management & Medication Refills. Pt states she logs her meals, exercising daily. Pt states she's been eating healthier. Pt states she's very active. Pt states she started juice plus gummies. Pt is up to date with Colonoscopy & Mammogram. Chronic Knee pain- Was seeing Ortho at [facility] re knee pain. Pt had x-rays done over a year ago. pt having intermittent knee pain. Pt using Lifewave patches to help with knee pain and is helping. Pt found out about this through women's resource group  Also having stem wave treatment for plantar fasciitis.  Follow up on hypertension. Using antihypertensive medication as prescribed without difficulty. Denies associated signs and symptoms including chest pain, shortness of breath, cough, headache, peripheral swelling, cramps, spasms, palpitations. Voices understanding of the potential for interference with blood pressure control with substances including high sodium intake, decongestants, herbal supplements, weight loss supplements, nutritional supplements. Blood pressures at home are less than 140/90. Low potassium- pt has had hx of low potassium despite being on Losartan . Pt taking KCL 20 meq daily but is running low on med.  Pt has been making KCL stretch the past few months. Pt is also trying to increase foods high in potassium.  Hot flashes: Pt does take Primrose Oil 1,000 mg capsule daily.  Patient thinks that that has made a huge difference in the quality of life.  Patient not having hot flashes and able to sleep at nighttime.  Seasonal Allergies: Pt does take Loratadine 10 mg daily without any complications. Pt has not had any issues with her allergies.  Scalp lesion/cyst on the right side of the  scalp for the past 3-1/2 years.  Patient states this started as a tiny pimple now its bigger.  Patient has taken photos over time to show evolution of this lesion.  Patient states she will get purulence from it and then has been using essential oils which help dry it up and then it will recur again.  Patient thinks its gotten bigger in the last couple of months.  No previous history of skin cancers or family history of melanoma.  Patient would like to see dermatology closer to her home. Vitamin D  Deficiecy: will recheck today. The 10-year ASCVD risk score (Arnett DK, et al., 2019) is: 3.9%   Values used to calculate the score:     Age: 34 years     Sex: Female     Is Non-Hispanic African American: Yes     Diabetic: No     Tobacco smoker: No     Systolic Blood Pressure: 124 mmHg     Is BP treated: Yes     HDL Cholesterol: 79 MG/DL     Total Cholesterol: 258 MG/DL The 89-bzjm ASCVD risk score (Arnett DK, et al., 2019) is: 3.8%   Values used to calculate the score:     Age: 14 years     Sex: Female     Is Non-Hispanic African American: Yes     Diabetic: No     Tobacco smoker: No     Systolic Blood Pressure: 124 mmHg     Is BP treated: Yes     HDL Cholesterol: 77 mg/dL  Total Cholesterol: 242 mg/dL  Review of Systems  Constitutional: Negative for fever, malaise/fatigue and weight loss.  HENT: Negative for ear pain and hearing loss.  Eyes: Negative for blurred vision, double vision and pain.  Respiratory: Negative for shortness of breath and wheezing.  Cardiovascular: Negative for chest pain, palpitations and leg swelling.  Gastrointestinal: Negative for abdominal pain, blood in stool, nausea and vomiting.  Genitourinary: Negative for dysuria and urgency.  Musculoskeletal: Negative for joint pain.  Skin: Negative for rash.  Outpatient Encounter Medications as of 12/26/2023  Medication Sig   albuterol  (VENTOLIN  HFA) 108 (90 Base) MCG/ACT inhaler Inhale 2 puffs into the lungs every 6  (six) hours as needed.   fluticasone  (FLONASE) 50 MCG/ACT nasal spray Place into both nostrils daily.   loratadine (CLARITIN) 10 MG tablet Take 10 mg by mouth.   potassium chloride  SA (KLOR-CON  M20) 20 MEQ tablet Take 1 tablet (20 mEq total) by mouth daily.   Prenatal Vit-Fe Fumarate-FA (PRENATAL MULTIVITAMIN) TABS tablet Take 1 tablet by mouth daily at 12 noon.   [DISCONTINUED] amLODipine  (NORVASC ) 10 MG tablet TAKE 1 TABLET (10 MG TOTAL) BY MOUTH DAILY.   [DISCONTINUED] losartan  (COZAAR ) 25 MG tablet Take 25 mg by mouth daily.   amLODipine  (NORVASC ) 10 MG tablet Take 1 tablet (10 mg total) by mouth daily.   EVENING PRIMROSE OIL PO Take by mouth.   Fluticasone -Salmeterol (ADVAIR DISKUS) 100-50 MCG/DOSE AEPB Inhale 1 puff into the lungs 2 (two) times daily.   losartan  (COZAAR ) 25 MG tablet Take 1 tablet (25 mg total) by mouth daily.   No facility-administered encounter medications on file as of 12/26/2023.    Past Medical History:  Diagnosis Date   Adjustment disorder with anxiety 10/13/2014   Airway hyperreactivity 10/13/2014   Asthma    Essential (primary) hypertension 09/22/2014   Fibroids, intramural 10/13/2014   Headache, menstrual migraine 10/13/2014    Past Surgical History:  Procedure Laterality Date   COLONOSCOPY WITH PROPOFOL  N/A 09/14/2018   Procedure: COLONOSCOPY WITH PROPOFOL ;  Surgeon: Janalyn Keene NOVAK, MD;  Location: [facility] ENDOSCOPY;  Service: Endoscopy;  Laterality: N/A;   COSMETIC SURGERY      Family History  Problem Relation Age of Onset   Hypertension Mother    Breast cancer Mother 53   Heart attack Father    Breast cancer Cousin    Colon cancer Cousin     Social History   Socioeconomic History   Marital status: Single    Spouse name: Not on file   Number of children: Not on file   Years of education: Not on file   Highest education level: Not on file  Occupational History   Not on file  Tobacco Use   Smoking status: Never   Smokeless tobacco:  Never  Vaping Use   Vaping status: Never Used  Substance and Sexual Activity   Alcohol use: Yes    Comment: occ   Drug use: No   Sexual activity: Yes    Comment: partner surgical  Other Topics Concern   Not on file  Social History Narrative   Not on file   Social Drivers of Health   Financial Resource Strain: Not on file  Food Insecurity: Not on file  Transportation Needs: Not on file  Physical Activity: Not on file  Stress: Not on file  Social Connections: Not on file  Intimate Partner Violence: Not on file    ROS      Objective    BP 124/82 (BP  Location: Left Arm)   Pulse 68   Temp 98.4 F (36.9 C)   Ht 4' 11.5 (1.511 m)   Wt 150 lb 2 oz (68.1 kg)   SpO2 97%   BMI 29.81 kg/m   Physical Exam Vitals and nursing note reviewed.  Constitutional:      Appearance: Normal appearance.  HENT:     Head: Normocephalic.     Right Ear: Tympanic membrane, ear canal and external ear normal.     Left Ear: Tympanic membrane, ear canal and external ear normal.  Eyes:     Extraocular Movements: Extraocular movements intact.     Conjunctiva/sclera: Conjunctivae normal.     Pupils: Pupils are equal, round, and reactive to light.  Cardiovascular:     Rate and Rhythm: Normal rate and regular rhythm.     Heart sounds: Normal heart sounds.  Pulmonary:     Effort: Pulmonary effort is normal.     Breath sounds: Normal breath sounds.  Musculoskeletal:     Right lower leg: No edema.     Left lower leg: No edema.  Skin:    General: Skin is warm.     Findings: Lesion present.     Comments: Patient has 1.5 cm erythematous raised lesion/cyst over the right posterior scalp.  No opening or drainage noted.  Neurological:     General: No focal deficit present.     Mental Status: She is alert and oriented to person, place, and time. Mental status is at baseline.  Psychiatric:        Mood and Affect: Mood normal.        Behavior: Behavior normal.        Thought Content: Thought  content normal.        Judgment: Judgment normal.         Assessment & Plan:   Problem List Items Addressed This Visit       Cardiovascular and Mediastinum   Essential (primary) hypertension - Primary   Relevant Medications   amLODipine  (NORVASC ) 10 MG tablet   losartan  (COZAAR ) 25 MG tablet   Other Relevant Orders   CBC with Differential/Platelet   Comprehensive metabolic panel   Hot flashes   Relevant Medications   amLODipine  (NORVASC ) 10 MG tablet   losartan  (COZAAR ) 25 MG tablet   Other Relevant Orders   Magnesium     Other   Adjustment disorder with anxiety   Low serum potassium   Relevant Medications   potassium chloride  SA (KLOR-CON  M20) 20 MEQ tablet   Vitamin D  deficiency   Relevant Orders   Vitamin D , 25-hydroxy   Epidermal inclusion cyst   Relevant Orders   Ambulatory referral to Dermatology    No follow-ups on file.   Connie Emperor, MD

## 2023-12-27 LAB — COMPREHENSIVE METABOLIC PANEL
AG Ratio: 1.6 (calc) (ref 1.0–2.5)
ALT: 14 U/L (ref 6–29)
AST: 15 U/L (ref 10–35)
Albumin: 4.9 g/dL (ref 3.6–5.1)
Alkaline phosphatase (APISO): 94 U/L (ref 37–153)
BUN: 11 mg/dL (ref 7–25)
CO2: 30 mmol/L (ref 20–32)
Calcium: 10.4 mg/dL (ref 8.6–10.4)
Chloride: 102 mmol/L (ref 98–110)
Creat: 0.68 mg/dL (ref 0.50–1.03)
Globulin: 3.1 g/dL (ref 1.9–3.7)
Glucose, Bld: 87 mg/dL (ref 65–99)
Potassium: 3.6 mmol/L (ref 3.5–5.3)
Sodium: 142 mmol/L (ref 135–146)
Total Bilirubin: 0.4 mg/dL (ref 0.2–1.2)
Total Protein: 8 g/dL (ref 6.1–8.1)

## 2023-12-27 LAB — CBC WITH DIFFERENTIAL/PLATELET
Absolute Lymphocytes: 2024 {cells}/uL (ref 850–3900)
Absolute Monocytes: 322 {cells}/uL (ref 200–950)
Basophils Absolute: 41 {cells}/uL (ref 0–200)
Basophils Relative: 0.9 %
Eosinophils Absolute: 110 {cells}/uL (ref 15–500)
Eosinophils Relative: 2.4 %
HCT: 42.7 % (ref 35.0–45.0)
Hemoglobin: 14.4 g/dL (ref 11.7–15.5)
MCH: 30.3 pg (ref 27.0–33.0)
MCHC: 33.7 g/dL (ref 32.0–36.0)
MCV: 89.7 fL (ref 80.0–100.0)
MPV: 10.4 fL (ref 7.5–12.5)
Monocytes Relative: 7 %
Neutro Abs: 2102 {cells}/uL (ref 1500–7800)
Neutrophils Relative %: 45.7 %
Platelets: 324 10*3/uL (ref 140–400)
RBC: 4.76 10*6/uL (ref 3.80–5.10)
RDW: 12.8 % (ref 11.0–15.0)
Total Lymphocyte: 44 %
WBC: 4.6 10*3/uL (ref 3.8–10.8)

## 2023-12-27 LAB — VITAMIN D 25 HYDROXY (VIT D DEFICIENCY, FRACTURES): Vit D, 25-Hydroxy: 20 ng/mL — ABNORMAL LOW (ref 30–100)

## 2023-12-27 LAB — MAGNESIUM: Magnesium: 2.2 mg/dL (ref 1.5–2.5)

## 2024-01-14 ENCOUNTER — Other Ambulatory Visit: Payer: Medicaid Other

## 2024-01-14 DIAGNOSIS — Z13220 Encounter for screening for lipoid disorders: Secondary | ICD-10-CM

## 2024-01-14 DIAGNOSIS — I10 Essential (primary) hypertension: Secondary | ICD-10-CM

## 2024-01-14 DIAGNOSIS — E876 Hypokalemia: Secondary | ICD-10-CM

## 2024-01-15 LAB — CBC WITH DIFFERENTIAL/PLATELET
Absolute Lymphocytes: 1853 {cells}/uL (ref 850–3900)
Absolute Monocytes: 283 {cells}/uL (ref 200–950)
Basophils Absolute: 41 {cells}/uL (ref 0–200)
Basophils Relative: 1 %
Eosinophils Absolute: 90 {cells}/uL (ref 15–500)
Eosinophils Relative: 2.2 %
HCT: 42.9 % (ref 35.0–45.0)
Hemoglobin: 14.1 g/dL (ref 11.7–15.5)
MCH: 29.9 pg (ref 27.0–33.0)
MCHC: 32.9 g/dL (ref 32.0–36.0)
MCV: 90.9 fL (ref 80.0–100.0)
MPV: 10.3 fL (ref 7.5–12.5)
Monocytes Relative: 6.9 %
Neutro Abs: 1833 {cells}/uL (ref 1500–7800)
Neutrophils Relative %: 44.7 %
Platelets: 316 10*3/uL (ref 140–400)
RBC: 4.72 10*6/uL (ref 3.80–5.10)
RDW: 12.9 % (ref 11.0–15.0)
Total Lymphocyte: 45.2 %
WBC: 4.1 10*3/uL (ref 3.8–10.8)

## 2024-01-15 LAB — LIPID PANEL
Cholesterol: 242 mg/dL — ABNORMAL HIGH (ref <200)
HDL: 77 mg/dL (ref >=50)
LDL Cholesterol (Calc): 149 mg/dL — ABNORMAL HIGH
Non-HDL Cholesterol (Calc): 165 mg/dL — ABNORMAL HIGH (ref <130)
Total CHOL/HDL Ratio: 3.1 (calc) (ref <5.0)
Triglycerides: 64 mg/dL (ref <150)

## 2024-01-15 LAB — COMPLETE METABOLIC PANEL WITH GFR
AG Ratio: 1.7 (calc) (ref 1.0–2.5)
ALT: 13 U/L (ref 6–29)
AST: 17 U/L (ref 10–35)
Albumin: 4.8 g/dL (ref 3.6–5.1)
Alkaline phosphatase (APISO): 85 U/L (ref 37–153)
BUN: 10 mg/dL (ref 7–25)
CO2: 30 mmol/L (ref 20–32)
Calcium: 10 mg/dL (ref 8.6–10.4)
Chloride: 105 mmol/L (ref 98–110)
Creat: 0.73 mg/dL (ref 0.50–1.03)
Globulin: 2.8 g/dL (ref 1.9–3.7)
Glucose, Bld: 83 mg/dL (ref 65–99)
Potassium: 3.7 mmol/L (ref 3.5–5.3)
Sodium: 143 mmol/L (ref 135–146)
Total Bilirubin: 0.4 mg/dL (ref 0.2–1.2)
Total Protein: 7.6 g/dL (ref 6.1–8.1)
eGFR: 97 mL/min/{1.73_m2} (ref >=60)

## 2024-01-16 ENCOUNTER — Encounter: Payer: Self-pay | Admitting: Podiatry

## 2024-01-16 ENCOUNTER — Ambulatory Visit: Payer: Medicaid Other | Admitting: Podiatry

## 2024-01-16 ENCOUNTER — Encounter: Payer: Self-pay | Admitting: Family Medicine

## 2024-01-16 ENCOUNTER — Ambulatory Visit (INDEPENDENT_AMBULATORY_CARE_PROVIDER_SITE_OTHER): Payer: Medicaid Other

## 2024-01-16 DIAGNOSIS — M722 Plantar fascial fibromatosis: Secondary | ICD-10-CM

## 2024-01-16 DIAGNOSIS — B351 Tinea unguium: Secondary | ICD-10-CM

## 2024-01-16 MED ORDER — TERBINAFINE HCL 250 MG PO TABS: 250.0000 mg | ORAL_TABLET | Freq: Every day | ORAL | 0 refills | Status: DC

## 2024-01-16 NOTE — Progress Notes (Unsigned)
   Chief Complaint  Patient presents with   Nail Problem    "I have some toenail discoloration." N - toenail discoloration L - 1-5 bilateral foot D - 4 years O - gradually worse C - black, thick A - none T - antifungal topical, tetri oil, cut them low   Foot Pain    "I have Plantar Fasciitis in my right foot." N - heel pain L - plantar heel right D - 4 mos. O - suddenly C - tender, sharp pain A - extended walking T - Stem Wave - 10 sessions by Chiropractor, stretching.    Subjective: 58 y.o. female presenting today ***  Past Medical History:  Diagnosis Date   Adjustment disorder with anxiety 10/13/2014   Airway hyperreactivity 10/13/2014   Asthma    Essential (primary) hypertension 09/22/2014   Fibroids, intramural 10/13/2014   Headache, menstrual migraine 10/13/2014    Past Surgical History:  Procedure Laterality Date   COLONOSCOPY WITH PROPOFOL N/A 09/14/2018   Procedure: COLONOSCOPY WITH PROPOFOL;  Surgeon: Pasty Spillers, MD;  Location: [facility] ENDOSCOPY;  Service: Endoscopy;  Laterality: N/A;   COSMETIC SURGERY      No Known Allergies  Objective: Physical Exam General: The patient is alert and oriented x3 in no acute distress.  Dermatology: Hyperkeratotic, discolored, thickened, onychodystrophy noted. Skin is warm, dry and supple bilateral lower extremities. Negative for open lesions or macerations.  Vascular: Palpable pedal pulses bilaterally. No edema or erythema noted. Capillary refill within normal limits.  Neurological: Grossly intact via light touch  Musculoskeletal Exam: No pedal deformity noted  Assessment: #1 Onychomycosis of toenails  Plan of Care:  #1 Patient was evaluated. #2  Today we discussed different treatment options including oral, topical, and laser antifungal treatment modalities.  We discussed their efficacies and side effects.  Patient opts for oral antifungal treatment modality #3 prescription for Lamisil 250 mg #90 daily.  Pt denies a history of liver pathology or symptoms.  Patient is otherwise healthy #4 return to clinic 6 months   Felecia Shelling, DPM [facility]  Dr. Felecia Shelling, DPM    [address] [place], [place] [address]                Office [phone]  Fax [phone]

## 2024-01-20 ENCOUNTER — Encounter: Payer: Medicaid Other | Admitting: Family Medicine

## 2024-01-21 ENCOUNTER — Encounter: Payer: Medicaid Other | Admitting: Family Medicine

## 2024-01-27 ENCOUNTER — Ambulatory Visit (INDEPENDENT_AMBULATORY_CARE_PROVIDER_SITE_OTHER): Payer: Medicaid Other | Admitting: Family Medicine

## 2024-01-27 ENCOUNTER — Encounter: Payer: Self-pay | Admitting: Family Medicine

## 2024-01-27 VITALS — BP 124/82 | HR 75 | Ht 59.5 in | Wt 147.6 lb

## 2024-01-27 DIAGNOSIS — Z23 Encounter for immunization: Secondary | ICD-10-CM | POA: Diagnosis not present

## 2024-01-27 DIAGNOSIS — J4520 Mild intermittent asthma, uncomplicated: Secondary | ICD-10-CM

## 2024-01-27 DIAGNOSIS — I10 Essential (primary) hypertension: Secondary | ICD-10-CM

## 2024-01-27 DIAGNOSIS — Z0001 Encounter for general adult medical examination with abnormal findings: Secondary | ICD-10-CM

## 2024-01-27 DIAGNOSIS — L729 Follicular cyst of the skin and subcutaneous tissue, unspecified: Secondary | ICD-10-CM

## 2024-01-27 DIAGNOSIS — E559 Vitamin D deficiency, unspecified: Secondary | ICD-10-CM | POA: Diagnosis not present

## 2024-01-27 DIAGNOSIS — Z0000 Encounter for general adult medical examination without abnormal findings: Secondary | ICD-10-CM

## 2024-01-27 NOTE — Progress Notes (Signed)
Complete physical exam  Assessment & Plan:    Routine Health Maintenance and Physical Exam Discussed health benefits of physical activity, and encouraged her to engage in regular exercise appropriate for her age and condition.  Preventative health care  Essential (primary) hypertension  Need for shingles vaccine  Vitamin D deficiency  Mild intermittent asthma without complication  Scalp cyst  Need for vaccination -     Varicella-zoster vaccine IM   Recommend healthy diet i.e mediterranean/DASH diet, consistent exercise - 30 minutes 5 day per week, and gradual weight loss. Return in about 4 months (around 05/26/2024), or if symptoms worsen or fail to improve, for hypertension. Pt will need 2nd Shingrix vaccine next time        Subjective:  Patient ID: Briana [name], female    DOB: 1966/07/09  Age: 58 y.o. MRN: [id number] Chief Complaint  Patient presents with   Annual Exam    Briana [name] is a 58 y.o. female who presents today for a complete physical exam.  Colonoscopy-UTD-needs repeat 2029 Mammogram-negative December 2024, needs repeat in Dec 2025 at [facility] Pap smear- Per OB-GYN, UTD Shingrix-not yet, will get today Tetanus - 2023 STI- not at this time, HIV and Hep C testing in the past HTN-using antihypertensive medication without difficulty.  Denies associated signs and symptoms including chest pain, shortness of breath, cough headache, peripheral swelling cramps spasms and palpitations.  Voices understanding of the potential for interference with blood pressure control with substances including high sodium intake, decongestions, herbal supplements weight loss supplements nutritional supplements.  Blood pressures at home are less than 140/90.    Vitamin D deficiency- pt has been taking this.  Scalp cyst- pt could not get into Derm office, they do not take new patients. Pt will try [facility] or another facility.  Asthma- pt has not had any flare up recently. NO UC  or ER visits.  Fungal toenails-patient was started on Lamisil 250 once a day.  Patient will have blood work done in 3 months however she will see if it can be repeated in 1 month to make sure her liver tests stay normal.  Patient does have a follow-up with podiatry in the future. Health Maintenance  Topic Date Due   Pap with HPV screening  03/01/2024   COVID-19 Vaccine (6 - 2024-25 season) 02/12/2024*   Flu Shot  03/15/2024*   Mammogram  07/26/2024*   Pneumococcal Vaccination (1 of 2 - PCV) 01/26/2025*   Zoster (Shingles) Vaccine (2 of 2) 03/23/2024   Colon Cancer Screening  09/14/2028   DTaP/Tdap/Td vaccine (2 - Td or Tdap) 05/14/2032   Hepatitis C Screening  Completed   HIV Screening  Completed   HPV Vaccine  Aged Out  *Topic was postponed. The date shown is not the original due date.    Most recent fall risk assessment:    01/27/2024    8:46 AM  Fall Risk   Falls in the past year? 0  Number falls in past yr: 0  Injury with Fall? 0  Risk for fall due to : No Fall Risks  Follow up Falls prevention discussed;Falls evaluation completed     Most recent depression screenings:    01/27/2024    8:46 AM 12/26/2023   12:02 PM  PHQ 2/9 Scores  PHQ - 2 Score 0 0      The 10-year ASCVD risk score (Arnett DK, et al., 2019) is: 3.8%   Values used to calculate the score:  Age: 7 years     Sex: Female     Is Non-Hispanic African American: Yes     Diabetic: No     Tobacco smoker: No     Systolic Blood Pressure: 124 mmHg     Is BP treated: Yes     HDL Cholesterol: 77 mg/dL     Total Cholesterol: 242 mg/dL  Past Surgical History:  Procedure Laterality Date   COLONOSCOPY WITH PROPOFOL N/A 09/14/2018   Procedure: COLONOSCOPY WITH PROPOFOL;  Surgeon: Pasty Spillers, MD;  Location: [facility] ENDOSCOPY;  Service: Endoscopy;  Laterality: N/A;   COSMETIC SURGERY     Social History   Tobacco Use   Smoking status: Never   Smokeless tobacco: Never  Vaping Use   Vaping status:  Never Used  Substance Use Topics   Alcohol use: Yes    Comment: occ   Drug use: No   Social History   Socioeconomic History   Marital status: Single    Spouse name: Not on file   Number of children: Not on file   Years of education: Not on file   Highest education level: Not on file  Occupational History   Not on file  Tobacco Use   Smoking status: Never   Smokeless tobacco: Never  Vaping Use   Vaping status: Never Used  Substance and Sexual Activity   Alcohol use: Yes    Comment: occ   Drug use: No   Sexual activity: Yes    Comment: partner surgical  Other Topics Concern   Not on file  Social History Narrative   Pt lives in [place], married. Has son and daughter. Son age 34 lives with patient.    Social Drivers of Corporate investment banker Strain: Not on file  Food Insecurity: Not on file  Transportation Needs: Not on file  Physical Activity: Not on file  Stress: Not on file  Social Connections: Not on file  Intimate Partner Violence: Not on file   Family History  Problem Relation Age of Onset   Hypertension Mother    Breast cancer Mother 42   Heart attack Father    Hypertension Sister    Breast cancer Cousin    Colon cancer Cousin    Diabetes Mellitus II Son    Uterine cancer Neg Hx    Ovarian cancer Neg Hx    No Known Allergies   Patient Care Team: Bernadette Hoit, MD as PCP - General (Family Medicine)   Outpatient Medications Prior to Visit  Medication Sig   albuterol (VENTOLIN HFA) 108 (90 Base) MCG/ACT inhaler Inhale 2 puffs into the lungs every 6 (six) hours as needed.   amLODipine (NORVASC) 10 MG tablet Take 1 tablet (10 mg total) by mouth daily.   EVENING PRIMROSE OIL PO Take by mouth.   fluticasone (FLONASE) 50 MCG/ACT nasal spray Place into both nostrils daily.   loratadine (CLARITIN) 10 MG tablet Take 10 mg by mouth.   losartan (COZAAR) 25 MG tablet Take 1 tablet (25 mg total) by mouth daily.   potassium chloride SA (KLOR-CON M20) 20  MEQ tablet Take 1 tablet (20 mEq total) by mouth daily.   Prenatal Vit-Fe Fumarate-FA (PRENATAL MULTIVITAMIN) TABS tablet Take 1 tablet by mouth daily at 12 noon.   terbinafine (LAMISIL) 250 MG tablet Take 1 tablet (250 mg total) by mouth daily.   Fluticasone-Salmeterol (ADVAIR DISKUS) 100-50 MCG/DOSE AEPB Inhale 1 puff into the lungs 2 (two) times daily.   No facility-administered medications  prior to visit.    ROS     Objective:    BP 124/82   Pulse 75   Ht 4' 11.5" (1.511 m)   Wt 147 lb 9.6 oz (67 kg)   SpO2 99%   BMI 29.31 kg/m  BP Readings from Last 3 Encounters:  01/27/24 124/82  12/26/23 124/82  12/18/21 117/80   Wt Readings from Last 3 Encounters:  01/27/24 147 lb 9.6 oz (67 kg)  12/26/23 150 lb 2 oz (68.1 kg)  12/18/21 150 lb 8 oz (68.3 kg)    Physical Exam Vitals and nursing note reviewed.  Constitutional:      Appearance: Normal appearance.  HENT:     Head: Normocephalic.     Right Ear: Tympanic membrane, ear canal and external ear normal.     Left Ear: Tympanic membrane, ear canal and external ear normal.     Nose: Nose normal.     Mouth/Throat:     Mouth: Mucous membranes are moist.     Pharynx: Oropharynx is clear.  Eyes:     Extraocular Movements: Extraocular movements intact.     Conjunctiva/sclera: Conjunctivae normal.     Pupils: Pupils are equal, round, and reactive to light.  Neck:     Thyroid: No thyroid mass, thyromegaly or thyroid tenderness.  Cardiovascular:     Rate and Rhythm: Normal rate and regular rhythm.     Heart sounds: Normal heart sounds.  Pulmonary:     Effort: Pulmonary effort is normal.     Breath sounds: Normal breath sounds. No wheezing.  Chest:  Breasts:    Breasts are symmetrical.     Right: Normal. No inverted nipple, mass or nipple discharge.     Left: Normal. No inverted nipple, mass or nipple discharge.  Abdominal:     General: Bowel sounds are normal.     Palpations: Abdomen is soft.     Tenderness: There is  no abdominal tenderness. There is no guarding.  Musculoskeletal:        General: No tenderness. Normal range of motion.     Cervical back: Normal range of motion and neck supple.     Right lower leg: No edema.     Left lower leg: No edema.  Lymphadenopathy:     Upper Body:     Right upper body: No supraclavicular or axillary adenopathy.     Left upper body: No supraclavicular or axillary adenopathy.  Skin:    General: Skin is warm and dry.     Findings: Lesion present.     Comments: Right side posterior scalp excoriated cyst present.   Neurological:     General: No focal deficit present.     Mental Status: She is alert and oriented to person, place, and time. Mental status is at baseline.     Cranial Nerves: No cranial nerve deficit.     Sensory: No sensory deficit.     Motor: No weakness.     Coordination: Coordination normal.     Gait: Gait normal.     Deep Tendon Reflexes: Reflexes normal.  Psychiatric:        Mood and Affect: Mood normal.        Behavior: Behavior normal.        Thought Content: Thought content normal.        Judgment: Judgment normal.      No results found for any visits on 01/27/24.      Bernadette Hoit, MD

## 2024-02-26 ENCOUNTER — Other Ambulatory Visit: Payer: Self-pay | Admitting: Family Medicine

## 2024-02-26 NOTE — Telephone Encounter (Unsigned)
 Copied from CRM [id number]. Topic: Clinical - Medication Refill >> Feb 26, 2024 10:06 AM Martha Clan wrote: Most Recent Primary Care Visit:  Provider: Bernadette Hoit  Department: [facility]  Visit Type: PHYSICAL  Date: 01/27/2024  Medication: albuterol (VENTOLIN HFA) 108 (90 Base) MCG/ACT inhaler [[id number]]  Has the patient contacted their pharmacy? No (Agent: If no, request that the patient contact the pharmacy for the refill. If patient does not wish to contact the pharmacy document the reason why and proceed with request.) (Agent: If yes, when and what did the pharmacy advise?)  Is this the correct pharmacy for this prescription? Yes If no, delete pharmacy and type the correct one.  This is the patient's preferred pharmacy:  [id number] - [address] [place] [address] Phone: [phone] Fax: [phone]   Has the prescription been filled recently? No  Is the patient out of the medication? Yes  Has the patient been seen for an appointment in the last year OR does the patient have an upcoming appointment? No  Can we respond through MyChart? Yes  Agent: Please be advised that Rx refills may take up to 3 business days. We ask that you follow-up with your pharmacy.

## 2024-04-21 ENCOUNTER — Encounter (HOSPITAL_COMMUNITY): Payer: Self-pay

## 2024-04-23 ENCOUNTER — Other Ambulatory Visit: Payer: Self-pay | Admitting: Podiatry

## 2024-04-28 ENCOUNTER — Ambulatory Visit (INDEPENDENT_AMBULATORY_CARE_PROVIDER_SITE_OTHER): Payer: Medicaid Other

## 2024-04-28 DIAGNOSIS — Z23 Encounter for immunization: Secondary | ICD-10-CM | POA: Diagnosis not present

## 2024-04-28 DIAGNOSIS — Z124 Encounter for screening for malignant neoplasm of cervix: Secondary | ICD-10-CM

## 2024-04-28 NOTE — Progress Notes (Signed)
 Patient is in office today for a nurse visit for Immunization. Patient Injection was given in the  Right deltoid. Patient tolerated injection well.

## 2024-05-17 ENCOUNTER — Telehealth: Payer: Self-pay | Admitting: Podiatry

## 2024-05-17 ENCOUNTER — Other Ambulatory Visit: Payer: Self-pay | Admitting: Podiatry

## 2024-05-17 DIAGNOSIS — Z79899 Other long term (current) drug therapy: Secondary | ICD-10-CM

## 2024-05-17 NOTE — Progress Notes (Signed)
 Patient needs updated hepatic function prior to refill.  Order placed.  Once she has that completed and liver function is WNL we can go ahead and refill the oral Lamisil .  Thanks, Dr. Luster Salters

## 2024-05-17 NOTE — Telephone Encounter (Signed)
 Patient is requesting refill on lamisil 

## 2024-06-01 LAB — HEPATIC FUNCTION PANEL
ALT: 13 IU/L (ref 0–32)
AST: 17 IU/L (ref 0–40)
Albumin: 4.5 g/dL (ref 3.8–4.9)
Alkaline Phosphatase: 111 IU/L (ref 44–121)
Bilirubin Total: 0.3 mg/dL (ref 0.0–1.2)
Bilirubin, Direct: 0.09 mg/dL (ref 0.00–0.40)
Total Protein: 7.3 g/dL (ref 6.0–8.5)

## 2024-06-08 ENCOUNTER — Encounter: Payer: Self-pay | Admitting: Podiatry

## 2024-06-08 ENCOUNTER — Ambulatory Visit: Admitting: Podiatry

## 2024-06-08 VITALS — Ht 59.5 in | Wt 147.6 lb

## 2024-06-08 DIAGNOSIS — B351 Tinea unguium: Secondary | ICD-10-CM

## 2024-06-08 MED ORDER — TERBINAFINE HCL 250 MG PO TABS: 250.0000 mg | ORAL_TABLET | Freq: Every day | ORAL | 0 refills | Status: AC

## 2024-06-08 NOTE — Progress Notes (Signed)
   Chief Complaint  Patient presents with   Nail Problem    Pt is here to f/u  on the discoloration of bilateral toenails, she states that there is a little difference to toenails.    Subjective: 58 y.o. female presenting today for follow-up evaluation of toenail fungus bilateral.  She completed 90 days of the oral Lamisil  without complication.  Requesting refill today.  LFT on 05/31/2024 WNL  Past Medical History:  Diagnosis Date   Adjustment disorder with anxiety 10/13/2014   Airway hyperreactivity 10/13/2014   Asthma    Essential (primary) hypertension 09/22/2014   Fibroids, intramural 10/13/2014   Headache, menstrual migraine 10/13/2014    Past Surgical History:  Procedure Laterality Date   COLONOSCOPY WITH PROPOFOL  N/A 09/14/2018   Procedure: COLONOSCOPY WITH PROPOFOL ;  Surgeon: Janalyn Keene NOVAK, MD;  Location: [facility] ENDOSCOPY;  Service: Endoscopy;  Laterality: N/A;   COSMETIC SURGERY      No Known Allergies   B/L toenails 01/16/2024  Objective: Physical Exam General: The patient is alert and oriented x3 in no acute distress.  Dermatology: Hyperkeratotic, discolored, thickened, onychodystrophy noted. Skin is warm, dry and supple bilateral lower extremities. Negative for open lesions or macerations.  Vascular: Palpable pedal pulses bilaterally. No edema or erythema noted. Capillary refill within normal limits.  Neurological: Grossly intact via light touch  Musculoskeletal Exam: No pedal deformity noted.  1  Radiographic exam RT foot 01/16/2024: Normal osseous mineralization.  Joint spaces preserved.  Impression: Negative  Assessment: #1 Onychomycosis of toenails bilateral  Plan of Care:  -Patient was evaluated. Overall improvement   Refill Prescription for Lamisil  250 mg #90 daily. Pt denies a history of liver pathology or symptoms.  LFT 05/31/2024 WNL -Return to clinic 6 months follow-up   Thresa EMERSON Sar, DPM [facility]  Dr. Thresa EMERSON Sar,  DPM    [address] [place], [place] [address]                Office [phone]  Fax [phone]

## 2024-07-12 NOTE — Progress Notes (Unsigned)
    GYNECOLOGY PROGRESS NOTE  Subjective:    Patient ID: Briana [name], female    DOB: Jul 02, 1966, 58 y.o.   MRN: [id number]  HPI  Patient is a 58 y.o. [id number] female who presents for evaluation for vaginal spotting. She is postmenopausal. She had some menopausal bleeding back in 2022. Hormone levels were checked to see if she was in menopause at that time and she was. She has had no bleeding since 2022 until June 26th. She had spotting and then a few days of a light flow and then a few days of more spotting. She has not seen any bleeding in the past 27 days. She does feel like she had cycle type symptoms during that time as well such as breast pain and increased bowel movements.   The following portions of the patient's history were reviewed and updated as appropriate: allergies, current medications, past family history, past medical history, past social history, past surgical history, and problem list.  Review of Systems Pertinent items are noted in HPI.   Objective:   Blood pressure 114/83, pulse 65, resp. rate 16, height 4' 11.5 (1.511 m), weight 147 lb 8 oz (66.9 kg), last menstrual period 08/02/2021. Body mass index is 29.29 kg/m. General appearance: alert, cooperative, and no distress Abdomen: soft, non-tender; bowel sounds normal; no masses,  no organomegaly Pelvic: cervix normal in appearance, external genitalia normal, no adnexal masses or tenderness, no cervical motion tenderness, rectovaginal septum normal, uterus normal size, shape, and consistency, and vagina normal without discharge Extremities: extremities normal, atraumatic, no cyanosis or edema Neurologic: Grossly normal   Assessment:   1. PMB (postmenopausal bleeding)      Plan:   -No signs of bleeding on exam as well as no source identified. Discussed possible etiology including cervical polyps, thinning vaginal tissue, infections, UTI and GI bleeding. Reviewed endometrial cancer as well. Plan to test for  infection and get an ultrasound to assessment endometrial thickness. Pap collected today.\    Damien Parsley, CNM [facility]

## 2024-07-13 ENCOUNTER — Other Ambulatory Visit (HOSPITAL_COMMUNITY)
Admission: RE | Admit: 2024-07-13 | Discharge: 2024-07-13 | Disposition: A | Discharge status: Home / Self Care | Source: Ambulatory Visit | Attending: Certified Nurse Midwife | Admitting: Certified Nurse Midwife

## 2024-07-13 ENCOUNTER — Encounter: Payer: Self-pay | Admitting: Certified Nurse Midwife

## 2024-07-13 ENCOUNTER — Ambulatory Visit (INDEPENDENT_AMBULATORY_CARE_PROVIDER_SITE_OTHER): Admitting: Certified Nurse Midwife

## 2024-07-13 VITALS — BP 114/83 | HR 65 | Resp 16 | Ht 59.5 in | Wt 147.5 lb

## 2024-07-13 DIAGNOSIS — N950 Postmenopausal bleeding: Secondary | ICD-10-CM

## 2024-07-14 ENCOUNTER — Other Ambulatory Visit: Payer: Self-pay | Admitting: Certified Nurse Midwife

## 2024-07-14 ENCOUNTER — Ambulatory Visit: Payer: Self-pay | Admitting: Certified Nurse Midwife

## 2024-07-14 LAB — CERVICOVAGINAL ANCILLARY ONLY
Bacterial Vaginitis (gardnerella): POSITIVE — AB
Candida Glabrata: NEGATIVE
Candida Vaginitis: NEGATIVE
Comment: NEGATIVE
Comment: NEGATIVE
Comment: NEGATIVE

## 2024-07-14 MED ORDER — METRONIDAZOLE 500 MG PO TABS: 500.0000 mg | ORAL_TABLET | Freq: Two times a day (BID) | ORAL | 0 refills | Status: AC

## 2024-07-16 ENCOUNTER — Ambulatory Visit: Payer: Medicaid Other | Admitting: Podiatry

## 2024-07-18 LAB — CYTOLOGY - PAP
Adequacy: ABSENT
Diagnosis: NEGATIVE

## 2024-07-27 ENCOUNTER — Other Ambulatory Visit: Payer: Self-pay | Admitting: Family Medicine

## 2024-07-30 ENCOUNTER — Ambulatory Visit

## 2024-07-30 DIAGNOSIS — N950 Postmenopausal bleeding: Secondary | ICD-10-CM

## 2024-08-08 ENCOUNTER — Other Ambulatory Visit: Payer: Self-pay | Admitting: Family Medicine

## 2024-08-10 NOTE — Telephone Encounter (Signed)
 Needs appt.  Requested Prescriptions  Pending Prescriptions Disp Refills   amLODipine  (NORVASC ) 10 MG tablet [Pharmacy Med Name: AMLODIPINE  BESYLATE 10 MG TAB] 30 tablet 0    Sig: TAKE 1 TABLET BY MOUTH EVERY DAY     Cardiovascular: Calcium Channel Blockers 2 Failed - 08/10/2024 10:46 AM      Failed - Valid encounter within last 6 months    Recent Outpatient Visits           6 months ago Preventative health care   [place] [facility] Aletha Bene, MD   7 months ago Essential (primary) hypertension   [place] [facility] Aletha Bene, MD              Passed - Last BP in normal range    BP Readings from Last 1 Encounters:  07/13/24 114/83         Passed - Last Heart Rate in normal range    Pulse Readings from Last 1 Encounters:  07/13/24 65

## 2024-09-01 ENCOUNTER — Telehealth: Payer: Self-pay

## 2024-09-01 NOTE — Telephone Encounter (Signed)
 Patient called about her ultrasound she had done one 08/15 for PMB and to evaluate endometrial thickness. She would like to know the results of the u/s. Please call patient or advise me on what to call patient with.  CB

## 2024-09-02 ENCOUNTER — Other Ambulatory Visit: Payer: Self-pay | Admitting: Family Medicine

## 2024-09-02 NOTE — Telephone Encounter (Signed)
 Requested Prescriptions  Pending Prescriptions Disp Refills   amLODipine  (NORVASC ) 10 MG tablet [Pharmacy Med Name: AMLODIPINE  BESYLATE 10 MG TAB] 90 tablet 0    Sig: TAKE 1 TABLET BY MOUTH EVERY DAY     Cardiovascular: Calcium Channel Blockers 2 Failed - 09/02/2024  5:06 PM      Failed - Valid encounter within last 6 months    Recent Outpatient Visits           7 months ago Preventative health care   [place] [facility] Aletha Bene, MD   8 months ago Essential (primary) hypertension   [place] [facility] Aletha Bene, MD              Passed - Last BP in normal range    BP Readings from Last 1 Encounters:  07/13/24 114/83         Passed - Last Heart Rate in normal range    Pulse Readings from Last 1 Encounters:  07/13/24 65

## 2024-09-13 ENCOUNTER — Ambulatory Visit: Admitting: Family Medicine

## 2024-09-13 ENCOUNTER — Encounter: Payer: Self-pay | Admitting: Family Medicine

## 2024-09-13 VITALS — BP 128/84 | HR 73 | Temp 98.4°F | Ht 59.5 in | Wt 146.2 lb

## 2024-09-13 DIAGNOSIS — I10 Essential (primary) hypertension: Secondary | ICD-10-CM | POA: Diagnosis not present

## 2024-09-13 DIAGNOSIS — B351 Tinea unguium: Secondary | ICD-10-CM | POA: Diagnosis not present

## 2024-09-13 DIAGNOSIS — J4520 Mild intermittent asthma, uncomplicated: Secondary | ICD-10-CM | POA: Diagnosis not present

## 2024-09-13 LAB — CBC WITH DIFFERENTIAL/PLATELET
Absolute Lymphocytes: 1901 {cells}/uL (ref 850–3900)
Absolute Monocytes: 352 {cells}/uL (ref 200–950)
Basophils Absolute: 22 {cells}/uL (ref 0–200)
Basophils Relative: 0.5 %
Eosinophils Absolute: 79 {cells}/uL (ref 15–500)
Eosinophils Relative: 1.8 %
HCT: 39.8 % (ref 35.0–45.0)
Hemoglobin: 13.5 g/dL (ref 11.7–15.5)
MCH: 30.7 pg (ref 27.0–33.0)
MCHC: 33.9 g/dL (ref 32.0–36.0)
MCV: 90.5 fL (ref 80.0–100.0)
MPV: 10.2 fL (ref 7.5–12.5)
Monocytes Relative: 8 %
Neutro Abs: 2046 {cells}/uL (ref 1500–7800)
Neutrophils Relative %: 46.5 %
Platelets: 288 Thousand/uL (ref 140–400)
RBC: 4.4 Million/uL (ref 3.80–5.10)
RDW: 12.9 % (ref 11.0–15.0)
Total Lymphocyte: 43.2 %
WBC: 4.4 Thousand/uL (ref 3.8–10.8)

## 2024-09-13 LAB — COMPREHENSIVE METABOLIC PANEL WITH GFR
AG Ratio: 1.8 (calc) (ref 1.0–2.5)
ALT: 12 U/L (ref 6–29)
AST: 15 U/L (ref 10–35)
Albumin: 4.6 g/dL (ref 3.6–5.1)
Alkaline phosphatase (APISO): 83 U/L (ref 37–153)
BUN: 9 mg/dL (ref 7–25)
CO2: 32 mmol/L (ref 20–32)
Calcium: 9.6 mg/dL (ref 8.6–10.4)
Chloride: 104 mmol/L (ref 98–110)
Creat: 0.63 mg/dL (ref 0.50–1.03)
Globulin: 2.6 g/dL (ref 1.9–3.7)
Glucose, Bld: 76 mg/dL (ref 65–99)
Potassium: 3.4 mmol/L — ABNORMAL LOW (ref 3.5–5.3)
Sodium: 141 mmol/L (ref 135–146)
Total Bilirubin: 0.3 mg/dL (ref 0.2–1.2)
Total Protein: 7.2 g/dL (ref 6.1–8.1)
eGFR: 104 mL/min/1.73m2

## 2024-09-13 MED ORDER — ALBUTEROL SULFATE HFA 108 (90 BASE) MCG/ACT IN AERS: 2.0000 | INHALATION_SPRAY | Freq: Four times a day (QID) | RESPIRATORY_TRACT | 1 refills | Status: AC | PRN

## 2024-09-13 NOTE — Progress Notes (Signed)
 Patient Office Visit  Assessment & Plan:  Essential (primary) hypertension -     CBC with Differential/Platelet -     Comprehensive metabolic panel with GFR  Fungal toenail infection  Mild intermittent asthma without complication -     Albuterol Sulfate HFA; Inhale 2 puffs into the lungs every 6 (six) hours as needed.  Dispense: 1 each; Refill: 1   Assessment and Plan    Asthma Asthma is well-managed with doTERRA oils. Albuterol inhaler was expired. - Prescribe albuterol inhaler for emergency use.  Hypertension Hypertension is well-controlled with current medication regimen. - Check kidney function and potassium levels.  Onychomycosis Chronic onychomycosis is being treated with Lamisil . Improvement is slow, as expected. - Continue Lamisil  as prescribed. - Monitor liver function as needed.  General Health Maintenance Discussed pneumococcal vaccine recommendation due to age and asthma. She wants to research the vaccine. - Provide information on pneumococcal vaccine for her to review. - Schedule next mammogram for December 2025.          Return in about 6 months (around 03/13/2025), or if symptoms worsen or fail to improve.   Subjective:    Patient ID: Briana [name], female    DOB: 25-Mar-1966  Age: 58 y.o. MRN: [id number]  Chief Complaint  Patient presents with   Medical Management of Chronic Issues    HPI Discussed the use of AI scribe software for clinical note transcription with the patient, who gave verbal consent to proceed.  History of Present Illness        Briana [name] is a 58 year old female who presents for a routine follow-up visit.  She is currently undergoing treatment for onychomycosis with Lamisil . She completed a three-month course, took a break, and is now on another three-month course. Her liver function was checked before starting the next round. She notes that the treatment is working slowly.  Her asthma has been stable without any  breathing problems. She has not used her Advair inhaler for years and instead uses doTERRA oils, specifically 'Breathe' and 'En Garde', which she applies to her chin and uses in a diffuser. She does not currently have an albuterol inhaler as the last one expired.  She has not needed medication for her blood pressure recently. 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 She reports walking 10,000 to 14,000 steps on busy days.  She primarily eats meals from outside sources, trying to choose healthier options like grilled chicken wraps and baked chicken wings. However, she has a weakness for sweets. Physical Exam CHEST: Lungs clear to auscultation bilaterally. Results Assessment & Plan Asthma Asthma is well-managed with doTERRA oils. Albuterol inhaler was expired. - Prescribe albuterol inhaler for emergency use.  Hypertension Hypertension is well-controlled with current medication regimen. - Check kidney function and potassium levels.  Onychomycosis Chronic onychomycosis is being treated with Lamisil . Improvement is slow, as expected. - Continue Lamisil  as prescribed. - Monitor liver function as needed.  General Health Maintenance Discussed pneumococcal vaccine recommendation due to age and asthma. She wants to research the vaccine. - Provide information on pneumococcal vaccine for her to review. - Schedule next mammogram for December 2025.    The 10-year ASCVD risk score (Arnett DK, et al., 2019) is: 4.7%  Past Medical History:  Diagnosis Date   Adjustment disorder with anxiety 10/13/2014   Airway hyperreactivity 10/13/2014   Asthma    Essential (primary) hypertension 09/22/2014  Fibroids, intramural 10/13/2014   Headache, menstrual migraine 10/13/2014   Past Surgical History:  Procedure Laterality Date   COLONOSCOPY WITH PROPOFOL  N/A 09/14/2018   Procedure: COLONOSCOPY WITH PROPOFOL ;  Surgeon:  Janalyn Keene NOVAK, MD;  Location: [facility] ENDOSCOPY;  Service: Endoscopy;  Laterality: N/A;   COSMETIC SURGERY     Social History   Tobacco Use   Smoking status: Never   Smokeless tobacco: Never  Vaping Use   Vaping status: Never Used  Substance Use Topics   Alcohol use: Yes    Comment: occ   Drug use: No   Family History  Problem Relation Age of Onset   Hypertension Mother    Breast cancer Mother 45   Heart attack Father    Hypertension Sister    Breast cancer Cousin    Colon cancer Cousin    Diabetes Mellitus II Son    Uterine cancer Neg Hx    Ovarian cancer Neg Hx    No Known Allergies  ROS    Objective:    BP 128/84   Pulse 73   Temp 98.4 F (36.9 C)   Ht 4' 11.5 (1.511 m)   Wt 146 lb 4 oz (66.3 kg)   LMP 08/02/2021 (Exact Date)   SpO2 99%   BMI 29.04 kg/m  BP Readings from Last 3 Encounters:  09/13/24 128/84  07/13/24 114/83  01/27/24 124/82   Wt Readings from Last 3 Encounters:  09/13/24 146 lb 4 oz (66.3 kg)  07/13/24 147 lb 8 oz (66.9 kg)  06/08/24 147 lb 9.6 oz (67 kg)    Physical Exam Vitals and nursing note reviewed.  Constitutional:      Appearance: Normal appearance.  HENT:     Head: Normocephalic.     Right Ear: Tympanic membrane, ear canal and external ear normal.     Left Ear: Tympanic membrane, ear canal and external ear normal.  Eyes:     Extraocular Movements: Extraocular movements intact.     Conjunctiva/sclera: Conjunctivae normal.     Pupils: Pupils are equal, round, and reactive to light.  Cardiovascular:     Rate and Rhythm: Normal rate and regular rhythm.     Heart sounds: Normal heart sounds.  Pulmonary:     Effort: Pulmonary effort is normal.     Breath sounds: Normal breath sounds.  Musculoskeletal:     Right lower leg: No edema.     Left lower leg: No edema.  Neurological:     General: No focal deficit present.     Mental Status: She is alert and oriented to person, place, and time.  Psychiatric:        Mood  and Affect: Mood normal.        Behavior: Behavior normal.        Thought Content: Thought content normal.        Judgment: Judgment normal.      No results found for any visits on 09/13/24.

## 2024-09-14 ENCOUNTER — Ambulatory Visit: Payer: Self-pay | Admitting: Family Medicine

## 2024-12-06 ENCOUNTER — Other Ambulatory Visit: Payer: Self-pay | Admitting: Family Medicine

## 2024-12-21 ENCOUNTER — Ambulatory Visit (INDEPENDENT_AMBULATORY_CARE_PROVIDER_SITE_OTHER): Admitting: Podiatry

## 2024-12-21 DIAGNOSIS — B351 Tinea unguium: Secondary | ICD-10-CM

## 2024-12-21 NOTE — Addendum Note (Signed)
 Addended by: CARLO PAO D on: 12/21/2024 11:51 AM   Modules accepted: Orders

## 2024-12-21 NOTE — Progress Notes (Signed)
" ° °  Chief Complaint  Patient presents with   Routine Post Op    6 m f/u discoloration toes and medication. She has had 2 cycles of Lamisil . They are better. She would like to discuss other options.     Subjective: 59 y.o. female presenting today for follow-up evaluation of toenail fungus bilateral.  She has now completed 6 months of oral Lamisil  daily.  There has been only minimal improvement.  She continues to have thick discoloration of the toenails  Past Medical History:  Diagnosis Date   Adjustment disorder with anxiety 10/13/2014   Airway hyperreactivity 10/13/2014   Asthma    Essential (primary) hypertension 09/22/2014   Fibroids, intramural 10/13/2014   Headache, menstrual migraine 10/13/2014    Past Surgical History:  Procedure Laterality Date   COLONOSCOPY WITH PROPOFOL  N/A 09/14/2018   Procedure: COLONOSCOPY WITH PROPOFOL ;  Surgeon: Janalyn Keene NOVAK, MD;  Location: [facility] ENDOSCOPY;  Service: Endoscopy;  Laterality: N/A;   COSMETIC SURGERY      No Known Allergies   B/L toenails 01/16/2024  Objective: Physical Exam General: The patient is alert and oriented x3 in no acute distress.  Dermatology: Mostly unchanged.  Hyperkeratotic, discolored, thickened, onychodystrophy noted. Skin is warm, dry and supple bilateral lower extremities. Negative for open lesions or macerations.  Vascular: Palpable pedal pulses bilaterally. No edema or erythema noted. Capillary refill within normal limits.  Neurological: Grossly intact via light touch  Musculoskeletal Exam: No pedal deformity noted.  1  Radiographic exam RT foot 01/16/2024: Normal osseous mineralization.  Joint spaces preserved.  Impression: Negative  Assessment: #1 Onychomycosis vs. repetitive microtrauma bilateral toenails  Plan of Care:  -Patient was evaluated.  Minimal improvement noted -Decision made today to proceed with nail biopsy of the left second toe since this is the most visually dystrophic -A nail  clipping was obtained today from the left second toe and placed in a specimen container and sent to [facility] -Will plan to contact the patient once results are available to review results and discuss further treatment options -Return to clinic PRN   Thresa EMERSON Sar, DPM [facility]  Dr. Thresa EMERSON Sar, DPM    [address] [place], [place] [address]                Office [phone]  Fax [phone]     "

## 2024-12-30 ENCOUNTER — Other Ambulatory Visit: Payer: Self-pay | Admitting: Podiatry

## 2025-02-08 ENCOUNTER — Encounter: Admitting: Family Medicine
